# Patient Record
Sex: Male | Born: 1995 | Hispanic: No | Marital: Single | Smoking: Current every day smoker
Health system: Southern US, Community
[De-identification: ages and names within clinical notes are randomized; demographics above are authoritative.]

---

## 2014-05-20 ENCOUNTER — Encounter (HOSPITAL_COMMUNITY): Payer: Self-pay | Admitting: Emergency Medicine

## 2014-05-20 ENCOUNTER — Emergency Department (HOSPITAL_COMMUNITY)
Admission: EM | Admit: 2014-05-20 | Discharge: 2014-05-21 | Disposition: A | Discharge status: Home / Self Care | Payer: Self-pay | Attending: Emergency Medicine | Admitting: Emergency Medicine

## 2014-05-20 DIAGNOSIS — F1290 Cannabis use, unspecified, uncomplicated: Secondary | ICD-10-CM | POA: Insufficient documentation

## 2014-05-20 DIAGNOSIS — Z720 Tobacco use: Secondary | ICD-10-CM | POA: Insufficient documentation

## 2014-05-20 LAB — RAPID URINE DRUG SCREEN, HOSP PERFORMED
AMPHETAMINES: NOT DETECTED
BARBITURATES: NOT DETECTED
Benzodiazepines: NOT DETECTED
Cocaine: NOT DETECTED
OPIATES: NOT DETECTED
Tetrahydrocannabinol: POSITIVE — AB

## 2014-05-20 MED ORDER — LORAZEPAM 2 MG/ML IJ SOLN
1.0000 mg | Freq: Once | INTRAMUSCULAR | Status: AC
  Administered 2014-05-20: 1 mg via INTRAMUSCULAR
  Filled 2014-05-20: qty 1

## 2014-05-20 MED ORDER — LORAZEPAM 1 MG PO TABS
1.0000 mg | ORAL_TABLET | Freq: Once | ORAL | Status: AC
  Administered 2014-05-20: 1 mg via ORAL
  Filled 2014-05-20: qty 1

## 2014-05-20 NOTE — ED Notes (Signed)
Pt resting comfortably, no signs of distress or pain noted.

## 2014-05-20 NOTE — ED Notes (Signed)
Pt EMS pt c/o mandibular stiffness and 10/10 cramping pain, onset after smoking marijuana 3 hours ago at 1500. Denies other issues currently.

## 2014-05-20 NOTE — ED Provider Notes (Signed)
CSN: [id number]     Arrival date & time 05/20/14  1829 History  This chart was scribed for Mathew Madura, PA-C, working with Mathew Mo, MD by Elon Spanner, ED Scribe. This patient was seen in room WTR9/WTR9 and the patient's care was started at 8:12 PM.    Chief Complaint  Patient presents with  . Jaw Pain   The history is provided by the patient. No language interpreter was used.   HPI Comments: Mathew Cooke is a 19 y.o. male who presents to the Emergency Department complaining of cramping jaw pain onset 3 hours ago after smoking marijuana.  He also complains of mouth numbness and neck pain. Patient states he has consumed alcohol today.  Patient reports a history of smoking marijuana.  Patient denies auditory/visual hallucinations.   History reviewed. No pertinent past medical history. History reviewed. No pertinent past surgical history. History reviewed. No pertinent family history. History  Substance Use Topics  . Smoking status: Current Every Day Smoker -- 1.00 packs/day    Types: Cigarettes  . Smokeless tobacco: Not on file  . Alcohol Use: Yes     Comment: Ocassionally     Review of Systems  Musculoskeletal: Positive for neck stiffness.  Psychiatric/Behavioral: Negative for hallucinations.  All other systems reviewed and are negative.   Allergies  Review of patient's allergies indicates no known allergies.  Home Medications   Prior to Admission medications   Not on File   BP 119/60 mmHg  Pulse 69  Temp(Src) 98.3 F (36.8 C) (Oral)  Resp 18  SpO2 97%   Physical Exam  Constitutional: He is oriented to person, place, and time. He appears well-developed and well-nourished. No distress.  Nontoxic/nonseptic appearing  HENT:  Head: Normocephalic and atraumatic.  Mouth/Throat: Oropharynx is clear and moist.  Eyes: Conjunctivae and EOM are normal. Pupils are equal, round, and reactive to light. No scleral icterus.  Neck: Normal range of motion. Neck supple.   Patient with his neck and a hyperextended state on initial presentation. He will follow commands and move his neck as instructed without difficulty. He exhibits normal range of motion.  Cardiovascular: Normal rate.   Pulmonary/Chest: Effort normal. No respiratory distress. He has no wheezes.  Respirations even and unlabored  Musculoskeletal: Normal range of motion.  Neurological: He is alert and oriented to person, place, and time. No cranial nerve deficit. He exhibits normal muscle tone. Coordination normal.  GCS 15. Speech is goal oriented. No cranial nerve deficits appreciated; symmetric eyebrow raise, no facial drooping, tongue midline. Patient has equal grip strength bilaterally and 5/5 strength against resistance in all extremities. Sensation to light touch intact. Patient ambulatory with steady gait.  Skin: Skin is warm and dry. No rash noted. He is not diaphoretic. No erythema. No pallor.  Psychiatric: He has a normal mood and affect. His behavior is normal.  Nursing note and vitals reviewed.   ED Course  Procedures (including critical care time)  DIAGNOSTIC STUDIES: Oxygen Saturation is 97% on RA, normal by my interpretation.    COORDINATION OF CARE:  8:25 PM Will order additional ativan.  Patient acknowledges and agrees with plan.    9:58 PM Upon recheck, no spasms or contracture noted.  Patient sleeping comfortably and snoring.    Labs Review Labs Reviewed  URINE RAPID DRUG SCREEN (HOSP PERFORMED) - Abnormal; Notable for the following:    Tetrahydrocannabinol POSITIVE (*)    All other components within normal limits    Imaging Review No results found.  EKG Interpretation None      MDM   Final diagnoses:  Marijuana use, episodic    19 year old male presents to the emergency department for further evaluation of neck pain and spasms after marijuana use. Suspect that marijuana was laced with a synthetic substance, not seen on UDS. Patient given Ativan in ED.  This resolved all of patient's symptoms, however, made him sleepy. As patient drove himself to the ED and denied any right home, patient kept in the ED for monitoring and evaluation. He has had no recurrence of his symptoms. He had a nonfocal neurologic exam on initial presentation. Patient has now been monitored for approximately 12 hours. Effects of the medication had worn off. No recurrence of neck spasms or pain. Patient stable for discharge at this time. Have advised patient to discontinue the use of marijuana.  I personally performed the services described in this documentation, which was scribed in my presence. The recorded information has been reviewed and is accurate.   Filed Vitals:   05/20/14 1829 05/20/14 1830 05/20/14 2127 05/21/14 0302  BP:  139/79 114/63 119/60  Pulse:  103 66 69  Temp:  98.6 F (37 C)  98.3 F (36.8 C)  TempSrc:  Oral  Oral  Resp:  SpO2: 100% 97% 96% 97%     Mathew Madura, PA-C 05/21/14 1610  Mathew Mo, MD 05/23/14 [phone]

## 2014-05-20 NOTE — ED Notes (Signed)
Pt had his head hyperextended while talking to this nurse.  States he cannot help it.  When pt was called to be placed in triage room 9, pt's head was not hyperextended.  Pt ambulated to the BR with head straight forward and not hyperextended.

## 2014-05-21 NOTE — ED Notes (Signed)
Pt has been able to eat, drink and ambulate to the BR without difficulty at this time.

## 2014-05-21 NOTE — Discharge Instructions (Signed)
Recommend that you discontinue marijuana use. Follow-up with your primary doctor as needed.  Marijuana Abuse Your exam shows you have used marijuana or pot. There are many health problems related to marijuana abuse. These include:  Bronchitis.  Chronic cough.  Emphysema.  Lung and upper airway cancer. Abusers also experience impairment in:  Memory.  Judgment.  Ability to learn.  Coordination. Students who smoke marijuana:  Get lower grades.  Are less likely to graduate than those who do not. Adults who abuse marijuana:  Have problems at work.  May even lose their jobs due to:  Poor work International aid/development worker.  Absenteeism. Attention, memory, and learning skills have been shown to be diminished for up to 6 months after stopping regular use, and there is evidence that the effects can be cumulative over a lifetime.  Heavier use of marijuana also puts a strain on relationships with friends and loved ones and can lead to moodiness and loss of confidence. Acute intoxication can lead to:  Increased anxiety.  A panic episode. It also increases the risk for having an automobile accident. This is especially true if the pot is combined with alcohol or other intoxicants. Treatment for acute intoxication is rarely needed. However, medicine to reduce anxiety may be helpful in some people. Millions of people are considered to be dependent on marijuana. It is long-term regular use that leads to addiction and all of its complex problems. Information on the problem of addiction and the health problems of long-term abuse is posted at the [facility] website, http://www.price-smith.com/. Consult with your doctor or counselor if you want further information and support in handling this common problem. Document Released: 05/26/2004 Document Revised: 07/11/2011 Document Reviewed: 03/13/2007 [facility] Patient Information 2015 [place], [place]. This information is not intended to replace advice  given to you by your health care provider. Make sure you discuss any questions you have with your health care provider.

## 2015-01-27 ENCOUNTER — Encounter (HOSPITAL_COMMUNITY): Payer: Self-pay

## 2015-01-27 ENCOUNTER — Emergency Department (HOSPITAL_COMMUNITY)
Admission: EM | Admit: 2015-01-27 | Discharge: 2015-01-27 | Disposition: A | Discharge status: Home / Self Care | Payer: Medicaid Other | Attending: Emergency Medicine | Admitting: Emergency Medicine

## 2015-01-27 DIAGNOSIS — R45851 Suicidal ideations: Secondary | ICD-10-CM

## 2015-01-27 DIAGNOSIS — R10819 Abdominal tenderness, unspecified site: Secondary | ICD-10-CM | POA: Diagnosis not present

## 2015-01-27 DIAGNOSIS — Z720 Tobacco use: Secondary | ICD-10-CM | POA: Insufficient documentation

## 2015-01-27 DIAGNOSIS — T1491XA Suicide attempt, initial encounter: Secondary | ICD-10-CM

## 2015-01-27 DIAGNOSIS — Y9389 Activity, other specified: Secondary | ICD-10-CM | POA: Insufficient documentation

## 2015-01-27 DIAGNOSIS — Y9289 Other specified places as the place of occurrence of the external cause: Secondary | ICD-10-CM | POA: Insufficient documentation

## 2015-01-27 DIAGNOSIS — T5491XA Toxic effect of unspecified corrosive substance, accidental (unintentional), initial encounter: Secondary | ICD-10-CM

## 2015-01-27 DIAGNOSIS — T50992A Poisoning by other drugs, medicaments and biological substances, intentional self-harm, initial encounter: Secondary | ICD-10-CM | POA: Insufficient documentation

## 2015-01-27 DIAGNOSIS — Y998 Other external cause status: Secondary | ICD-10-CM | POA: Insufficient documentation

## 2015-01-27 LAB — RAPID URINE DRUG SCREEN, HOSP PERFORMED
Amphetamines: NOT DETECTED
Barbiturates: NOT DETECTED
Benzodiazepines: NOT DETECTED
Cocaine: NOT DETECTED
OPIATES: NOT DETECTED
Tetrahydrocannabinol: NOT DETECTED

## 2015-01-27 LAB — CBC
HEMATOCRIT: 43.7 % (ref 39.0–52.0)
Hemoglobin: 14.9 g/dL (ref 13.0–17.0)
MCH: 31.8 pg (ref 26.0–34.0)
MCHC: 34.1 g/dL (ref 30.0–36.0)
MCV: 93.2 fL (ref 78.0–100.0)
Platelets: 230 10*3/uL (ref 150–400)
RBC: 4.69 MIL/uL (ref 4.22–5.81)
RDW: 12 % (ref 11.5–15.5)
WBC: 7.6 10*3/uL (ref 4.0–10.5)

## 2015-01-27 LAB — COMPREHENSIVE METABOLIC PANEL
ALK PHOS: 45 U/L (ref 38–126)
ALT: 15 U/L — AB (ref 17–63)
AST: 28 U/L (ref 15–41)
Albumin: 4 g/dL (ref 3.5–5.0)
Anion gap: 6 (ref 5–15)
BILIRUBIN TOTAL: 0.6 mg/dL (ref 0.3–1.2)
BUN: 10 mg/dL (ref 6–20)
CALCIUM: 9.5 mg/dL (ref 8.9–10.3)
CO2: 28 mmol/L (ref 22–32)
CREATININE: 0.92 mg/dL (ref 0.61–1.24)
Chloride: 103 mmol/L (ref 101–111)
Glucose, Bld: 87 mg/dL (ref 65–99)
Potassium: 4.5 mmol/L (ref 3.5–5.1)
Sodium: 137 mmol/L (ref 135–145)
TOTAL PROTEIN: 6.8 g/dL (ref 6.5–8.1)

## 2015-01-27 LAB — SALICYLATE LEVEL

## 2015-01-27 LAB — ACETAMINOPHEN LEVEL

## 2015-01-27 LAB — LIPASE, BLOOD: LIPASE: 28 U/L (ref 22–51)

## 2015-01-27 MED ORDER — ONDANSETRON 4 MG PO TBDP
4.0000 mg | ORAL_TABLET | Freq: Once | ORAL | Status: AC
  Administered 2015-01-27: 4 mg via ORAL
  Filled 2015-01-27: qty 1

## 2015-01-27 NOTE — ED Notes (Signed)
Tele-psych at bedside.

## 2015-01-27 NOTE — Discharge Instructions (Signed)
Patient with intentional ingestion of caustic substance. Eating and drinking normally. He will need strict suicide watch and close follow up with Psychiatrist and psych services in jail.  He will very likely need to be started on antidepressants. He is medically clear in terms of his caustic ingestion.  Suicidal Feelings, How to Help Yourself Everyone feels sad or unhappy at times, but depressing thoughts and feelings of hopelessness can lead to thoughts of suicide. It can seem as if life is too tough to handle. If you feel as though you have reached the point where suicide is the only answer, it is time to let someone know immediately.  HOW TO COPE AND PREVENT SUICIDE  Let family, friends, teachers, or counselors know. Get help. Try not to isolate yourself from those who care about you. Even though you may not feel sociable, talk with someone every day. It is best if it is face-to-face. Remember, they will want to help you.  Eat a regularly spaced and well-balanced diet.  Get plenty of rest.  Avoid alcohol and drugs because they will only make you feel worse and may also lower your inhibitions. Remove them from the home. If you are thinking of taking an overdose of your prescribed medicines, give your medicines to someone who can give them to you one day at a time. If you are on antidepressants, let your caregiver know of your feelings so he or she can provide a safer medicine, if that is a concern.  Remove weapons or poisons from your home.  Try to stick to routines. Follow a schedule and remind yourself that you have to keep that schedule every day.  Set some realistic goals and achieve them. Make a list and cross things off as you go. Accomplishments give a sense of worth. Wait until you are feeling better before doing things you find difficult or unpleasant to do.  If you are able, try to start exercising. Even half-hour periods of exercise each day will make you feel better. Getting out  in the sun or into nature helps you recover from depression faster. If you have a favorite place to walk, take advantage of that.  Increase safe activities that have always given you pleasure. This may include playing your favorite music, reading a good book, painting a picture, or playing your favorite instrument. Do whatever takes your mind off your depression.  Keep your living space well-lighted. GET HELP Contact a suicide hotline, crisis center, or local suicide prevention center for help right away. Local centers may include a hospital, clinic, community service organization, social service provider, or health department.  Call your local emergency services (911 in the [place]).  Call a suicide hotline:  1-800-273-TALK (726 146 7149) in the [place].  1-800-SUICIDE (365)503-9554) in the [place].  579-447-9476 in the [place] for Spanish-speaking counselors.  4-696-295-2WUX 5800663453) in the [place] for TTY users.  Visit the following websites for information and help:  National Suicide Prevention Lifeline: www.suicidepreventionlifeline.org  Hopeline: www.hopeline.com  [employer] for Suicide Prevention: https://www.ayers.com/  For lesbian, gay, bisexual, transgender, or questioning youth, contact [employer]:  3-664-4-I-HKVQQV 3083677043) in the [place].  www.thetrevorproject.org  In [place], treatment resources are listed in each province with listings available under [employer] for [employer] or similar titles. Another source for Crisis Centres by [place] is located at http://www.suicideprevention.ca/in-crisis-now/find-a-crisis-centre-now/crisis-centres Document Released: 10/23/2002 Document Revised: 07/11/2011 Document Reviewed: 08/13/2013 [facility] Patient Information 2015 [place], [place]. This information is not intended to replace  advice given to you by your health care provider. Make sure you discuss any  questions you have with your health care provider.  Major Depressive Disorder Major depressive disorder is a mental illness. It also may be called clinical depression or unipolar depression. Major depressive disorder usually causes feelings of sadness, hopelessness, or helplessness. Some people with this disorder do not feel particularly sad but lose interest in doing things they used to enjoy (anhedonia). Major depressive disorder also can cause physical symptoms. It can interfere with work, school, relationships, and other normal everyday activities. The disorder varies in severity but is longer lasting and more serious than the sadness we all feel from time to time in our lives. Major depressive disorder often is triggered by stressful life events or major life changes. Examples of these triggers include divorce, loss of your job or home, a move, and the death of a family member or close friend. Sometimes this disorder occurs for no obvious reason at all. People who have family members with major depressive disorder or bipolar disorder are at higher risk for developing this disorder, with or without life stressors. Major depressive disorder can occur at any age. It may occur just once in your life (single episode major depressive disorder). It may occur multiple times (recurrent major depressive disorder). SYMPTOMS People with major depressive disorder have either anhedonia or depressed mood on nearly a daily basis for at least 2 weeks or longer. Symptoms of depressed mood include:  Feelings of sadness (blue or down in the dumps) or emptiness.  Feelings of hopelessness or helplessness.  Tearfulness or episodes of crying (may be observed by others).  Irritability (children and adolescents). In addition to depressed mood or anhedonia or both, people with this disorder have at least four of the following symptoms:  Difficulty sleeping or sleeping too much.   Significant change (increase or  decrease) in appetite or weight.   Lack of energy or motivation.  Feelings of guilt and worthlessness.   Difficulty concentrating, remembering, or making decisions.  Unusually slow movement (psychomotor retardation) or restlessness (as observed by others).   Recurrent wishes for death, recurrent thoughts of self-harm (suicide), or a suicide attempt. People with major depressive disorder commonly have persistent negative thoughts about themselves, other people, and the world. People with severe major depressive disorder may experiencedistorted beliefs or perceptions about the world (psychotic delusions). They also may see or hear things that are not real (psychotic hallucinations). DIAGNOSIS Major depressive disorder is diagnosed through an assessment by your health care provider. Your health care provider will ask aboutaspects of your daily life, such as mood,sleep, and appetite, to see if you have the diagnostic symptoms of major depressive disorder. Your health care provider may ask about your medical history and use of alcohol or drugs, including prescription medicines. Your health care provider also may do a physical exam and blood work. This is because certain medical conditions and the use of certain substances can cause major depressive disorder-like symptoms (secondary depression). Your health care provider also may refer you to a mental health specialist for further evaluation and treatment. TREATMENT It is important to recognize the symptoms of major depressive disorder and seek treatment. The following treatments can be prescribed for this disorder:   Medicine. Antidepressant medicines usually are prescribed. Antidepressant medicines are thought to correct chemical imbalances in the brain that are commonly associated with major depressive disorder. Other types of medicine may be added if the symptoms do not respond to antidepressant medicines  alone or if psychotic delusions or  hallucinations occur.  Talk therapy. Talk therapy can be helpful in treating major depressive disorder by providing support, education, and guidance. Certain types of talk therapy also can help with negative thinking (cognitive behavioral therapy) and with relationship issues that trigger this disorder (interpersonal therapy). A mental health specialist can help determine which treatment is best for you. Most people with major depressive disorder do well with a combination of medicine and talk therapy. Treatments involving electrical stimulation of the brain can be used in situations with extremely severe symptoms or when medicine and talk therapy do not work over time. These treatments include electroconvulsive therapy, transcranial magnetic stimulation, and vagal nerve stimulation. Document Released: 08/13/2012 Document Revised: 09/02/2013 Document Reviewed: 08/13/2012 [facility] Patient Information 2015 [place], [place]. This information is not intended to replace advice given to you by your health care provider. Make sure you discuss any questions you have with your health care provider.

## 2015-01-27 NOTE — ED Notes (Signed)
Pt states he attempted to kill himself by drinking cleaning chemicals in prison. Pt c/o 10/10 abdominal pain and states "I can't feel my legs." Pt repeatedly states "I just wish I could die." EDP aware.
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
## 2015-01-27 NOTE — ED Provider Notes (Signed)
CSN: [id number]     Arrival date & time 01/27/15  8295 History   First MD Initiated Contact with Patient 01/27/15 [phone]     Chief Complaint  Patient presents with  . Ingestion     (Consider location/radiation/quality/duration/timing/severity/associated sxs/prior Treatment) HPI   19 year old male brought in by [employer]. He is a current inmate at the [facility]. Patient presents after intentional ingestion of 4 ounces of cleaning solution. Patient states that he was trying to kill himself. He states he has been in jail for about 4 days. He has no previous history of suicide attempts. Patient states that he needs to go to the psychiatric hospital. Patient states "I'm not playin'.  They can't keep me in no cage like an animal." Patient complains of abdominal pain and nausea currently. Patient drank 4 ounces of ammonium chlorohydrate  Which has a pH of 12-14. I have consulted with [facility]. They recommend nothing by mouth for 2 hours after initial ingestion. If patient feels ingestion. He will need a KUB and a GI consult. The recommended normal screening labs for suicide attempt, which included salicylates and Tylenol. The patient will need a minimum of 6 hours of observation. According to diabetes. He is currently under suicide watch at the jail.  No past medical history on file. No past surgical history on file. No family history on file. Social History  Substance Use Topics  . Smoking status: Current Every Day Smoker -- 1.00 packs/day    Types: Cigarettes  . Smokeless tobacco: Not on file  . Alcohol Use: Yes     Comment: Ocassionally    Review of Systems  Ten systems reviewed and are negative for acute change, except as noted in the HPI.    Allergies  Review of patient's allergies indicates no known allergies.  Home Medications   Prior to Admission medications   Not on File   Ht  (1.702 m)  Wt 160 lb (72.576 kg)  BMI 25.05  kg/m2 Physical Exam  Constitutional: He appears well-developed and well-nourished. No distress.  HENT:  Head: Normocephalic and atraumatic.  Eyes: Conjunctivae are normal. No scleral icterus.  Neck: Normal range of motion. Neck supple.  Cardiovascular: Normal rate, regular rhythm and normal heart sounds.   Pulmonary/Chest: Effort normal and breath sounds normal. No respiratory distress.  Abdominal: Soft. There is tenderness. There is guarding.  Patient with diffuse tenderness and guarding of the abdomen.  Musculoskeletal: He exhibits no edema.  Neurological: He is alert.  Skin: Skin is warm and dry. He is not diaphoretic.  Psychiatric: His behavior is normal.  Nursing note and vitals reviewed.   ED Course  Procedures (including critical care time) Labs Review Labs Reviewed - No data to display  Imaging Review No results found. I have personally reviewed and evaluated these images and lab results as part of my medical decision-making.   EKG Interpretation   Date/Time:  Tuesday January 27 2015 12:58:45 EDT Ventricular Rate:  78 PR Interval:  136 QRS Duration: 88 QT Interval:  393 QTC Calculation: 448 R Axis:   83 Text Interpretation:  Sinus rhythm EKG WITHIN NORMAL LIMITS Confirmed by  LITTLE MD, RACHEL ([id number]) on 01/27/2015 1:41:19 PM      MDM   Final diagnoses:  Ingestion of corrosive chemical, initial encounter  Suicidal intent   Patient able to tolerate po fluids and no vomiting. His labs are reassuring. Currently under observation    12:55 PM Patient under  watch of Deputies tied a cord around his neck and tried to choke himself. Dr. Clarene Duke is asking for consult with psych. I have ordered a telepsych consult.  2:30 Pm Patient observed for 6 hours without any complications, no vomiting, no abdominal pain. He is asking to eat.  Patient seen by Psych. There is a psychiatrist at the jail. No immenent threat of completed suicide and the patient can be treated  for his depression under strict suicide watch with their sevices per Psychiatric consult. Patient appears safe for discharge at this time/  Arthor Captain, PA-C 01/27/15 1556  Laurence Spates, MD 01/28/15 1659

## 2015-08-17 ENCOUNTER — Emergency Department (HOSPITAL_COMMUNITY)
Admission: EM | Admit: 2015-08-17 | Discharge: 2015-08-17 | Disposition: A | Discharge status: Home / Self Care | Payer: Medicaid Other | Attending: Emergency Medicine | Admitting: Emergency Medicine

## 2015-08-17 ENCOUNTER — Encounter (HOSPITAL_COMMUNITY): Payer: Self-pay

## 2015-08-17 ENCOUNTER — Emergency Department (HOSPITAL_COMMUNITY): Payer: Medicaid Other

## 2015-08-17 DIAGNOSIS — F17210 Nicotine dependence, cigarettes, uncomplicated: Secondary | ICD-10-CM | POA: Insufficient documentation

## 2015-08-17 DIAGNOSIS — M545 Low back pain: Secondary | ICD-10-CM | POA: Insufficient documentation

## 2015-08-17 DIAGNOSIS — R45851 Suicidal ideations: Secondary | ICD-10-CM | POA: Insufficient documentation

## 2015-08-17 DIAGNOSIS — M549 Dorsalgia, unspecified: Secondary | ICD-10-CM | POA: Diagnosis present

## 2015-08-17 MED ORDER — KETOROLAC TROMETHAMINE 30 MG/ML IJ SOLN
30.0000 mg | Freq: Once | INTRAMUSCULAR | Status: AC
  Administered 2015-08-17: 30 mg via INTRAMUSCULAR
  Filled 2015-08-17: qty 1
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
## 2015-08-17 NOTE — Discharge Instructions (Signed)
Back Pain, Adult Back pain is very common. The pain often gets better over time. The cause of back pain is usually not dangerous. Most people can learn to manage their back pain on their own.  HOME CARE  Watch your back pain for any changes. The following actions may help to lessen any pain you are feeling:  Stay active. Start with short walks on flat ground if you can. Try to walk farther each day.  Exercise regularly as told by your doctor. Exercise helps your back heal faster. It also helps avoid future injury by keeping your muscles strong and flexible.  Do not sit, drive, or stand in one place for more than 30 minutes.  Do not stay in bed. Resting more than 1-2 days can slow down your recovery.  Be careful when you bend or lift an object. Use good form when lifting:  Bend at your knees.  Keep the object close to your body.  Do not twist.  Sleep on a firm mattress. Lie on your side, and bend your knees. If you lie on your back, put a pillow under your knees.  Take medicines only as told by your doctor.  Put ice on the injured area.  Put ice in a plastic bag.  Place a towel between your skin and the bag.  Leave the ice on for 20 minutes, 2-3 times a day for the first 2-3 days. After that, you can switch between ice and heat packs.  Avoid feeling anxious or stressed. Find good ways to deal with stress, such as exercise.  Maintain a healthy weight. Extra weight puts stress on your back. GET HELP IF:   You have pain that does not go away with rest or medicine.  You have worsening pain that goes down into your legs or buttocks.  You have pain that does not get better in one week.  You have pain at night.  You lose weight.  You have a fever or chills. GET HELP RIGHT AWAY IF:   You cannot control when you poop (bowel movement) or pee (urinate).  Your arms or legs feel weak.  Your arms or legs lose feeling (numbness).  You feel sick to your stomach (nauseous) or  throw up (vomit).  You have belly (abdominal) pain.  You feel like you may pass out (faint).   This information is not intended to replace advice given to you by your health care provider. Make sure you discuss any questions you have with your health care provider.   Document Released: 10/05/2007 Document Revised: 05/09/2014 Document Reviewed: 08/20/2013 Elsevier Interactive Patient Education 2016 [employer].  Suicidal Feelings: How to Help Yourself Suicide is the taking of one's own life. If you feel as though life is getting too tough to handle and are thinking about suicide, get help right away. To get help:  Call your local emergency services (911 in the [place]  Call a suicide hotline to speak with a trained counselor who understands how you are feeling. The following is a list of suicide hotlines in the [place]. For a list of hotlines in [place], visit InkDistributor.it.  1-800-273-TALK (570)442-4799).  1-800-SUICIDE 587-601-3964).  781-817-4687. This is a hotline for Spanish speakers.  3-329-518-8CZY 514 678 8325). This is a hotline for TTY users.  1-866-4-U-TREVOR 270-582-6044). This is a hotline for lesbian, gay, bisexual, transgender, or questioning youth.  Contact a crisis center or a local suicide prevention center. To find a crisis center or suicide prevention center:  Call  your local hospital, clinic, community service organization, mental health center, social service provider, or health department. Ask for assistance in connecting to a crisis center.  Visit https://www.patel-king.com/ for a list of crisis centers in the [place], or visit www.suicideprevention.ca/thinking-about-suicide/find-a-crisis-centre for a list of centers in [place].  Visit the following websites:  National Suicide Prevention Lifeline: www.suicidepreventionlifeline.org  Hopeline:  www.hopeline.com  [employer] for Suicide Prevention: https://www.ayers.com/  [employer] (for lesbian, gay, bisexual, transgender, or questioning youth): www.thetrevorproject.org HOW CAN I HELP MYSELF FEEL BETTER?  Promise yourself that you will not do anything drastic when you have suicidal feelings. Remember, there is hope. Many people have gotten through suicidal thoughts and feelings, and you will, too. You may have gotten through them before, and this proves that you can get through them again.  Let family, friends, teachers, or counselors know how you are feeling. Try not to isolate yourself from those who care about you. Remember, they will want to help you. Talk with someone every day, even if you do not feel sociable. Face-to-face conversation is best.  Call a mental health professional and see one regularly.  Visit your primary health care provider every year.  Eat a well-balanced diet, and space your meals so you eat regularly.  Get plenty of rest.  Avoid alcohol and drugs, and remove them from your home. They will only make you feel worse.  If you are thinking of taking a lot of medicine, give your medicine to someone who can give it to you one day at a time. If you are on antidepressants and are concerned you will overdose, let your health care provider know so he or she can give you safer medicines. Ask your mental health professional about the possible side effects of any medicines you are taking.  Remove weapons, poisons, knives, and anything else that could harm you from your home.  Try to stick to routines. Follow a schedule every day. Put self-care on your schedule.  Make a list of realistic goals, and cross them off when you achieve them. Accomplishments give a sense of worth.  Wait until you are feeling better before doing the things you find difficult or unpleasant.  Exercise if you are able. You will feel better if you exercise for even a half hour each  day.  Go out in the sun or into nature. This will help you recover from depression faster. If you have a favorite place to walk, go there.  Do the things that have always given you pleasure. Play your favorite music, read a good book, paint a picture, play your favorite instrument, or do anything else that takes your mind off your depression if it is safe to do.  Keep your living space well lit.  When you are feeling well, write yourself a letter about tips and support that you can read when you are not feeling well.  Remember that life's difficulties can be sorted out with help. Conditions can be treated. You can work on thoughts and strategies that serve you well.   This information is not intended to replace advice given to you by your health care provider. Make sure you discuss any questions you have with your health care provider.   Document Released: 10/23/2002 Document Revised: 05/09/2014 Document Reviewed: 08/13/2013 Elsevier Interactive Patient Education [employer]

## 2015-08-17 NOTE — ED Notes (Signed)
Patient transported to X-ray 
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
## 2015-12-10 ENCOUNTER — Emergency Department (HOSPITAL_COMMUNITY)
Admission: EM | Admit: 2015-12-10 | Discharge: 2015-12-10 | Disposition: A | Discharge status: Home / Self Care | Payer: Medicaid Other | Attending: Emergency Medicine | Admitting: Emergency Medicine

## 2015-12-10 ENCOUNTER — Encounter (HOSPITAL_COMMUNITY): Payer: Self-pay | Admitting: *Deleted

## 2015-12-10 DIAGNOSIS — F17210 Nicotine dependence, cigarettes, uncomplicated: Secondary | ICD-10-CM | POA: Insufficient documentation

## 2015-12-10 DIAGNOSIS — L02416 Cutaneous abscess of left lower limb: Secondary | ICD-10-CM | POA: Diagnosis present

## 2015-12-10 DIAGNOSIS — L0291 Cutaneous abscess, unspecified: Secondary | ICD-10-CM

## 2015-12-10 MED ORDER — SULFAMETHOXAZOLE-TRIMETHOPRIM 800-160 MG PO TABS: 1.0000 | ORAL_TABLET | Freq: Two times a day (BID) | ORAL | 0 refills | Status: DC

## 2015-12-10 NOTE — ED Provider Notes (Signed)
[facility]PT Provider Note   CSN: [id number] Arrival date & time: 12/10/15  1041  First Provider Contact:  None    By signing my name below, I, Essence Howell, attest that this documentation has been prepared under the direction and in the presence of [employer], PA-C Electronically Signed: Charline Bills, ED Scribe 12/10/2015 at 11:00 AM.   History   Chief Complaint Chief Complaint  Patient presents with  . Insect Bite   HPI Mathew Cooke is a 20 y.o. male who presents to the Emergency Department complaining of a possible insect bite to the left lower extremity first noticed 2 days ago. Pt reports intermittent 8/10 sharp, non-radiating pain that is exacerbated with palpation to the insect bite region/abscess. No treatments tried PTA. He reports associated redness, swelling, and mild warmth to the area. Pt denies red streaking, drainage, fevers, chills, CP, SOB, abd pain, N/V/D/C, hematuria, dysuria, arthralgias, numbness, tingling, weakness, or any other associated symptoms. Denies IVDU. Denies seeing an insect bite him. Denies immunosuppressant conditions.   The history is provided by the patient and medical records. No language interpreter was used.  Abscess  Location:  Leg Leg abscess location:  L lower leg Abscess quality: painful, redness and warmth   Abscess quality: not draining and no fluctuance   Red streaking: no   Duration:  2 days Progression:  Unchanged Pain details:    Quality:  Sharp   Severity:  Moderate   Duration:  2 days   Timing:  Intermittent   Progression:  Unchanged Chronicity:  New Context: insect bite/sting (possible)   Context: not diabetes, not immunosuppression, not injected drug use and not skin injury   Relieved by:  None tried Worsened by:  Draining/squeezing Ineffective treatments:  None tried Associated symptoms: no fever, no nausea and no vomiting     No past medical history on file.  Patient Active Problem List   Diagnosis Date Noted  . First known suicide attempt (HCC) 01/27/2015    No past surgical history on file.   Home Medications    Prior to Admission medications   Medication Sig Start Date End Date Taking? Authorizing Provider  diphenhydramine-acetaminophen (TYLENOL PM) 25-500 MG TABS tablet Take 1 tablet by mouth at bedtime as needed (sleep/pain).    Historical Provider, MD    Family History No family history on file.  Social History Social History  Substance Use Topics  . Smoking status: Current Every Day Smoker    Packs/day: 1.00    Types: Cigarettes  . Smokeless tobacco: Not on file  . Alcohol use Yes     Comment: Ocassionally     Allergies   Review of patient's allergies indicates no known allergies.   Review of Systems Review of Systems  Constitutional: Negative for chills and fever.  Respiratory: Negative for shortness of breath.   Cardiovascular: Negative for chest pain.  Gastrointestinal: Negative for abdominal pain, constipation, diarrhea, nausea and vomiting.  Genitourinary: Negative for dysuria and hematuria.  Musculoskeletal: Positive for myalgias (abscess painful). Negative for arthralgias.  Skin: Positive for color change and wound (abscess).  Allergic/Immunologic: Negative for immunocompromised state.  Neurological: Negative for weakness and numbness.  Psychiatric/Behavioral: Negative for confusion.  10 Systems reviewed and are negative for acute change except as noted in the HPI.   Physical Exam Updated Vital Signs BP 128/76 (BP Location: Right Arm)   Pulse 74   Temp 98.2 F (36.8 C) (Oral)   Resp 12   SpO2 98%   Physical Exam  Constitutional: He is oriented to person, place, and time. Vital signs are normal. He appears well-developed and well-nourished.  Non-toxic appearance. No distress.  Afebrile, nontoxic, NAD  HENT:  Head: Normocephalic and atraumatic.  Mouth/Throat: Mucous membranes are normal.  Eyes: Conjunctivae and EOM are normal.  Right eye exhibits no discharge. Left eye exhibits no discharge.  Neck: Normal range of motion. Neck supple.  Cardiovascular: Normal rate and intact distal pulses.   Pulmonary/Chest: Effort normal. No respiratory distress.  Abdominal: Normal appearance. He exhibits no distension.  Musculoskeletal: Normal range of motion.       Left lower leg: He exhibits tenderness and swelling (abscess). He exhibits no bony tenderness.  L anterior lower leg with small approximately 1 cm indurated abscess with mild erythema and warmth, no drainage or red streaking, no fluctuance. No surrounding cellulitis, with minimal TTP over the abscess but no other focal bony ttp. No pedal edema. strength and sensation grossly intact. Distal pulses intact. Compartments soft.   Neurological: He is alert and oriented to person, place, and time. He has normal strength. No sensory deficit.  Skin: Skin is warm, dry and intact. No rash noted. There is erythema (to abscess).  L leg abscess erythematous as described above  Psychiatric: He has a normal mood and affect.  Nursing note and vitals reviewed.   ED Treatments / Results  Labs (all labs ordered are listed, but only abnormal results are displayed) Labs Reviewed - No data to display  EKG  EKG Interpretation None       Radiology No results found.  Procedures Procedures (including critical care time) DIAGNOSTIC STUDIES: Oxygen Saturation is 98% on RA, normal by my interpretation.    COORDINATION OF CARE: 10:56 AM-Discussed treatment plan with pt at bedside and pt agreed to plan.   Medications Ordered in ED Medications - No data to display   Initial Impression / Assessment and Plan / ED Course  I have reviewed the triage vital signs and the nursing notes.  Pertinent labs & imaging results that were available during my care of the patient were reviewed by me and considered in my medical decision making (see chart for details).  Clinical Course    20  y.o. male here with small abscess to L leg, no fluctuance or evidence of surrounding cellulitis, NVI with soft compartments. Doubt it would be amendable to I&D today, will start on abx. F/up with PCP or UCC for recheck in 2 days to see if it becomes fluctuant. Wound care and heat care discussed. Tylenol/motrin for pain. I explained the diagnosis and have given explicit precautions to return to the ER including for any other new or worsening symptoms. The patient understands and accepts the medical plan as it's been dictated and I have answered their questions. Discharge instructions concerning home care and prescriptions have been given. The patient is STABLE and is discharged to home in good condition.   I personally performed the services described in this documentation, which was scribed in my presence. The recorded information has been reviewed and is accurate.  BP 128/76 (BP Location: Right Arm)   Pulse 74   Temp 98.2 F (36.8 C) (Oral)   Resp 12   SpO2 98%    Final Clinical Impressions(s) / ED Diagnoses   Final diagnoses:  Abscess    New Prescriptions New Prescriptions   SULFAMETHOXAZOLE-TRIMETHOPRIM (BACTRIM DS,SEPTRA DS) 800-160 MG TABLET    Take 1 tablet by mouth 2 (two) times daily.     France Ravens  Camprubi-Soms, PA-C 12/10/15 1106    Maia Plan, MD 12/10/15 [phone]

## 2015-12-10 NOTE — ED Triage Notes (Signed)
PT has a insect bite to RT anterior ,lower leg.

## 2015-12-10 NOTE — Discharge Instructions (Signed)
Keep wound clean and dry. Apply warm compresses to affected area throughout the day, no more than 20 minutes of heat to the skin per hour, at least 4 times daily. Take antibiotic until it is finished. Take tylenol or motrin as needed for pain. Followup with [facility]/Primary Care doctor in 2 days for wound recheck. Monitor area for signs of infection to include, but not limited to: increasing pain, spreading redness, drainage/pus, worsening swelling, or fevers. Return to emergency department for emergent changing or worsening symptoms.

## 2017-01-19 ENCOUNTER — Encounter (HOSPITAL_COMMUNITY): Payer: Self-pay | Admitting: Emergency Medicine

## 2017-01-19 DIAGNOSIS — Z79899 Other long term (current) drug therapy: Secondary | ICD-10-CM | POA: Diagnosis not present

## 2017-01-19 DIAGNOSIS — F17210 Nicotine dependence, cigarettes, uncomplicated: Secondary | ICD-10-CM | POA: Diagnosis not present

## 2017-01-19 DIAGNOSIS — Z202 Contact with and (suspected) exposure to infections with a predominantly sexual mode of transmission: Secondary | ICD-10-CM | POA: Diagnosis not present

## 2017-01-19 LAB — URINALYSIS, COMPLETE (UACMP) WITH MICROSCOPIC
BACTERIA UA: NONE SEEN
BILIRUBIN URINE: NEGATIVE
GLUCOSE, UA: NEGATIVE mg/dL
HGB URINE DIPSTICK: NEGATIVE
Ketones, ur: NEGATIVE mg/dL
Leukocytes, UA: NEGATIVE
NITRITE: NEGATIVE
Protein, ur: NEGATIVE mg/dL
Specific Gravity, Urine: 1.02 (ref 1.005–1.030)
pH: 5 (ref 5.0–8.0)

## 2017-01-19 NOTE — ED Triage Notes (Signed)
Patient requesting STD screening reports his sexual partner has urinary discomfort/vaginal discharge and multiple sexual encounters. Denies fever or chills . No penile discharge or irritation .

## 2017-01-20 ENCOUNTER — Emergency Department (HOSPITAL_COMMUNITY)
Admission: EM | Admit: 2017-01-20 | Discharge: 2017-01-20 | Disposition: A | Discharge status: Home / Self Care | Payer: Medicare Other | Attending: Emergency Medicine | Admitting: Emergency Medicine

## 2017-01-20 DIAGNOSIS — Z202 Contact with and (suspected) exposure to infections with a predominantly sexual mode of transmission: Secondary | ICD-10-CM

## 2017-01-20 NOTE — ED Notes (Signed)
ED Provider at bedside. 

## 2017-01-20 NOTE — ED Provider Notes (Signed)
[facility] Provider Note   CSN: [id number] Arrival date & time: 01/19/17  2124     History   Chief Complaint Chief Complaint  Patient presents with  . SEXUALLY TRANSMITTED DISEASE    HPI Mathew Cooke is a 21 y.o. male.  HPI Patient is a 21 year old male who is here with his girlfriend who is having vaginal discharge.  The patient wishes to be checked for STDs.  He has no dysuria or urinary frequency.  Denies penile discharge.  Denies abdominal pain.  No nausea.  Patient is without symptoms.  He just wants to be checked.   History reviewed. No pertinent past medical history.  Patient Active Problem List   Diagnosis Date Noted  . First known suicide attempt (HCC) 01/27/2015    History reviewed. No pertinent surgical history.     Home Medications    Prior to Admission medications   Medication Sig Start Date End Date Taking? Authorizing Provider  diphenhydramine-acetaminophen (TYLENOL PM) 25-500 MG TABS tablet Take 1 tablet by mouth at bedtime as needed (sleep/pain).    [provider]  sulfamethoxazole-trimethoprim (BACTRIM DS,SEPTRA DS) 800-160 MG tablet Take 1 tablet by mouth 2 (two) times daily. 12/10/15   Street, [place], PA-C    Family History No family history on file.  Social History Social History  Substance Use Topics  . Smoking status: Current Every Day Smoker    Packs/day: 1.00    Types: Cigarettes  . Smokeless tobacco: Never Used  . Alcohol use Yes     Comment: Ocassionally     Allergies   Patient has no known allergies.   Review of Systems Review of Systems  All other systems reviewed and are negative.    Physical Exam Updated Vital Signs BP (!) 128/92 (BP Location: Left Arm)   Pulse 95   Temp 98.4 F (36.9 C) (Oral)   Resp 18   Ht  (1.702 m)   Wt 72.6 kg (160 lb)   SpO2 99%   BMI 25.06 kg/m   Physical Exam  Constitutional: He is oriented to person, place, and time. He appears well-developed and  well-nourished.  HENT:  Head: Normocephalic.  Eyes: EOM are normal.  Neck: Normal range of motion.  Pulmonary/Chest: Effort normal.  Abdominal: He exhibits no distension.  Musculoskeletal: Normal range of motion.  Neurological: He is alert and oriented to person, place, and time.  Psychiatric: He has a normal mood and affect.  Nursing note and vitals reviewed.    ED Treatments / Results  Labs (all labs ordered are listed, but only abnormal results are displayed) Labs Reviewed  URINALYSIS, COMPLETE (UACMP) WITH MICROSCOPIC - Abnormal; Notable for the following:       Result Value   Squamous Epithelial / LPF 0-5 (*)    All other components within normal limits    EKG  EKG Interpretation None       Radiology No results found.  Procedures Procedures (including critical care time)  Medications Ordered in ED Medications - No data to display   Initial Impression / Assessment and Plan / ED Course  I have reviewed the triage vital signs and the nursing notes.  Pertinent labs & imaging results that were available during my care of the patient were reviewed by me and considered in my medical decision making (see chart for details).     Patient referred to the [facility] department for STD screening.  A symptomatically.  No indication for acute management or treatment  in the ER.  Final Clinical Impressions(s) / ED Diagnoses   Final diagnoses:  Possible exposure to STD    New Prescriptions New Prescriptions   No medications on file     Azalia Bilis, MD 01/20/17 1011

## 2017-01-21 ENCOUNTER — Emergency Department (HOSPITAL_COMMUNITY)
Admission: EM | Admit: 2017-01-21 | Discharge: 2017-01-22 | Disposition: A | Discharge status: Home / Self Care | Payer: Medicare Other | Attending: Emergency Medicine | Admitting: Emergency Medicine

## 2017-01-21 ENCOUNTER — Encounter (HOSPITAL_COMMUNITY): Payer: Self-pay | Admitting: Emergency Medicine

## 2017-01-21 DIAGNOSIS — F17210 Nicotine dependence, cigarettes, uncomplicated: Secondary | ICD-10-CM | POA: Diagnosis not present

## 2017-01-21 DIAGNOSIS — Z7251 High risk heterosexual behavior: Secondary | ICD-10-CM

## 2017-01-21 DIAGNOSIS — Z79899 Other long term (current) drug therapy: Secondary | ICD-10-CM | POA: Diagnosis not present

## 2017-01-21 DIAGNOSIS — R300 Dysuria: Secondary | ICD-10-CM | POA: Diagnosis present

## 2017-01-21 NOTE — ED Notes (Signed)
Called for triage. No answer. 

## 2017-01-21 NOTE — ED Notes (Signed)
Called for triage, no response.

## 2017-01-21 NOTE — ED Triage Notes (Signed)
Pt states he will like to have a HIV test on the ED pt was seen yesterday by Dr. Patria Mane and sent home with a follow up on [facility], pt denies any pain or any symptoms at this time.

## 2017-01-22 DIAGNOSIS — Z7251 High risk heterosexual behavior: Secondary | ICD-10-CM | POA: Diagnosis not present

## 2017-01-22 LAB — RPR: RPR: NONREACTIVE

## 2017-01-22 LAB — HIV ANTIBODY (ROUTINE TESTING W REFLEX): HIV Screen 4th Generation wRfx: NONREACTIVE

## 2017-01-22 NOTE — ED Notes (Signed)
Pt verbalized understanding of discharge instructions, nad, ambulatory upon d.c

## 2017-01-22 NOTE — ED Provider Notes (Signed)
[facility] Provider Note   CSN: [id number] Arrival date & time: 01/21/17  2237     History   Chief Complaint Chief Complaint  Patient presents with  . Exposure to STD    HPI Mathew Cooke is a 21 y.o. male.  21 year old male presents to the emergency department requesting STD testing. He was seen 2 days ago for similar complaints. He was told to follow-up at the health department, but has neglected to do so. He reports dysuria; however, his urinalysis 2 days ago showed no evidence of urinary tract infection and he denied dysuria in triage. He denies any other symptoms to suggest STI. He does have a history of unprotected sexual intercourse.   The history is provided by the patient. No language interpreter was used.  Exposure to STD     History reviewed. No pertinent past medical history.  Patient Active Problem List   Diagnosis Date Noted  . First known suicide attempt (HCC) 01/27/2015    History reviewed. No pertinent surgical history.     Home Medications    Prior to Admission medications   Medication Sig Start Date End Date Taking? Authorizing Provider  diphenhydramine-acetaminophen (TYLENOL PM) 25-500 MG TABS tablet Take 1 tablet by mouth at bedtime as needed (sleep/pain).    [provider]  sulfamethoxazole-trimethoprim (BACTRIM DS,SEPTRA DS) 800-160 MG tablet Take 1 tablet by mouth 2 (two) times daily. 12/10/15   Street, [place], PA-C    Family History No family history on file.  Social History Social History  Substance Use Topics  . Smoking status: Current Every Day Smoker    Packs/day: 1.00    Types: Cigarettes  . Smokeless tobacco: Never Used  . Alcohol use Yes     Comment: Ocassionally     Allergies   Patient has no known allergies.   Review of Systems Review of Systems Ten systems reviewed and are negative for acute change, except as noted in the HPI.    Physical Exam Updated Vital Signs BP (!) 147/100 (BP  Location: Right Arm)   Pulse 82   Temp 98.2 F (36.8 C) (Oral)   Resp 16   Ht 5\' 7"  (1.702 m)   Wt 72.6 kg (160 lb)   SpO2 98%   BMI 25.06 kg/m   Physical Exam  Constitutional: He is oriented to person, place, and time. He appears well-developed and well-nourished. No distress.  HENT:  Head: Normocephalic and atraumatic.  Eyes: Conjunctivae and EOM are normal. No scleral icterus.  Neck: Normal range of motion.  Cardiovascular: Normal rate, regular rhythm and intact distal pulses.   Pulmonary/Chest: Effort normal. No respiratory distress. He has no wheezes.  Musculoskeletal: Normal range of motion.  Neurological: He is alert and oriented to person, place, and time. He exhibits normal muscle tone. Coordination normal.  Skin: Skin is warm and dry. No rash noted. He is not diaphoretic. No erythema. No pallor.  Psychiatric: He has a normal mood and affect. His behavior is normal.  Nursing note and vitals reviewed.    ED Treatments / Results  Labs (all labs ordered are listed, but only abnormal results are displayed) Labs Reviewed  HIV ANTIBODY (ROUTINE TESTING)  RPR  GC/CHLAMYDIA PROBE AMP ([place]) NOT AT [facility]   Component     Latest Ref Rng & Units 01/19/2017  Color, Urine     YELLOW YELLOW  Appearance     CLEAR CLEAR  Specific Gravity, Urine     1.005 - 1.030 1.020  pH     5.0 - 8.0 5.0  Glucose     NEGATIVE mg/dL NEGATIVE  Hgb urine dipstick     NEGATIVE NEGATIVE  Bilirubin Urine     NEGATIVE NEGATIVE  Ketones, ur     NEGATIVE mg/dL NEGATIVE  Protein     NEGATIVE mg/dL NEGATIVE  Nitrite     NEGATIVE NEGATIVE  Leukocytes, UA     NEGATIVE NEGATIVE  RBC / HPF     0 - 5 RBC/hpf 0-5  WBC, UA     0 - 5 WBC/hpf 0-5  Bacteria, UA     NONE SEEN NONE SEEN  Squamous Epithelial / LPF     NONE SEEN 0-5 (A)  Mucus      PRESENT  Hyaline Casts, UA      PRESENT    EKG  EKG Interpretation None       Radiology No results found.  Procedures Procedures  (including critical care time)  Medications Ordered in ED Medications - No data to display   Initial Impression / Assessment and Plan / ED Course  I have reviewed the triage vital signs and the nursing notes.  Pertinent labs & imaging results that were available during my care of the patient were reviewed by me and considered in my medical decision making (see chart for details).     21 year old male presents requesting STD check. He was seen yesterday for similar complaints. He was told to follow-up with the health department, but neglected to do so. He returns today for STD testing. Patient with a negative urinalysis 48 hours prior. STD tests ordered. Patient aware that these take 48 hours to result. He has been instructed to follow-up further with the health department. No indication for further emergent work up at this time. Return precautions discussed and provided. Patient discharged in stable condition with no unaddressed concerns.   Vitals:   01/21/17 2309 01/21/17 2311  BP: (!) 147/100   Pulse: 82   Resp: 16   Temp: 98.2 F (36.8 C)   TempSrc: Oral   SpO2: 98%   Weight:  72.6 kg (160 lb)  Height:   (1.702 m)     Final Clinical Impressions(s) / ED Diagnoses   Final diagnoses:  History of unprotected sex    New Prescriptions New Prescriptions   No medications on file     [name], Cordelia Poche 01/22/17 0036    Cathren Laine, MD 01/24/17 1114

## 2017-01-23 LAB — GC/CHLAMYDIA PROBE AMP (~~LOC~~) NOT AT ARMC
CHLAMYDIA, DNA PROBE: NEGATIVE
Neisseria Gonorrhea: NEGATIVE

## 2017-02-12 ENCOUNTER — Emergency Department (HOSPITAL_COMMUNITY)
Admission: EM | Admit: 2017-02-12 | Discharge: 2017-02-12 | Disposition: A | Discharge status: Home / Self Care | Payer: Medicare Other | Attending: Emergency Medicine | Admitting: Emergency Medicine

## 2017-02-12 ENCOUNTER — Emergency Department (HOSPITAL_COMMUNITY): Payer: Medicare Other

## 2017-02-12 ENCOUNTER — Encounter (HOSPITAL_COMMUNITY): Payer: Self-pay

## 2017-02-12 DIAGNOSIS — J029 Acute pharyngitis, unspecified: Secondary | ICD-10-CM | POA: Diagnosis not present

## 2017-02-12 DIAGNOSIS — Z5321 Procedure and treatment not carried out due to patient leaving prior to being seen by health care provider: Secondary | ICD-10-CM | POA: Insufficient documentation

## 2017-02-12 DIAGNOSIS — R05 Cough: Secondary | ICD-10-CM | POA: Diagnosis not present

## 2017-02-12 NOTE — ED Notes (Signed)
Have called patient's name twice in lobby. No response.

## 2017-02-12 NOTE — ED Notes (Signed)
Patient left without being seen by EDP. No distress noted. No coughing noted. EDP notified.

## 2017-02-12 NOTE — ED Notes (Signed)
See EDP secondary assessment.  

## 2017-02-12 NOTE — ED Notes (Signed)
Before RN could even as patient questions for assessment, patient began asking about food - "you got any food back here? I ain't ate since yesterday." Patient sleeping in chair when RN entered room, and when RN left room.

## 2017-02-12 NOTE — ED Triage Notes (Signed)
Pt was sleeping in the waiting area, upon waking pt states that he wanted to be checked in for a cough, productive with green phelm and a sore throat. Denies fevers.

## 2018-02-22 ENCOUNTER — Other Ambulatory Visit: Payer: Self-pay

## 2018-02-22 ENCOUNTER — Emergency Department (HOSPITAL_COMMUNITY)
Admission: EM | Admit: 2018-02-22 | Discharge: 2018-02-22 | Disposition: A | Discharge status: Home / Self Care | Payer: Medicare Other

## 2018-02-22 NOTE — ED Notes (Signed)
Called pt x2 no answer

## 2019-05-18 ENCOUNTER — Emergency Department (HOSPITAL_COMMUNITY)
Admission: EM | Admit: 2019-05-18 | Discharge: 2019-05-19 | Disposition: A | Discharge status: Home / Self Care | Payer: Medicare Other | Attending: Emergency Medicine | Admitting: Emergency Medicine

## 2019-05-18 ENCOUNTER — Emergency Department (HOSPITAL_COMMUNITY): Payer: Medicare Other

## 2019-05-18 ENCOUNTER — Encounter (HOSPITAL_COMMUNITY): Payer: Self-pay

## 2019-05-18 ENCOUNTER — Other Ambulatory Visit: Payer: Self-pay

## 2019-05-18 DIAGNOSIS — T50992A Poisoning by other drugs, medicaments and biological substances, intentional self-harm, initial encounter: Secondary | ICD-10-CM | POA: Insufficient documentation

## 2019-05-18 DIAGNOSIS — S91322A Laceration with foreign body, left foot, initial encounter: Secondary | ICD-10-CM | POA: Diagnosis present

## 2019-05-18 DIAGNOSIS — Y280XXA Contact with sharp glass, undetermined intent, initial encounter: Secondary | ICD-10-CM | POA: Insufficient documentation

## 2019-05-18 DIAGNOSIS — X838XXA Intentional self-harm by other specified means, initial encounter: Secondary | ICD-10-CM | POA: Diagnosis not present

## 2019-05-18 DIAGNOSIS — Y9389 Activity, other specified: Secondary | ICD-10-CM | POA: Insufficient documentation

## 2019-05-18 DIAGNOSIS — R451 Restlessness and agitation: Secondary | ICD-10-CM | POA: Diagnosis not present

## 2019-05-18 DIAGNOSIS — Z23 Encounter for immunization: Secondary | ICD-10-CM | POA: Insufficient documentation

## 2019-05-18 DIAGNOSIS — F17210 Nicotine dependence, cigarettes, uncomplicated: Secondary | ICD-10-CM | POA: Insufficient documentation

## 2019-05-18 DIAGNOSIS — Y999 Unspecified external cause status: Secondary | ICD-10-CM | POA: Diagnosis not present

## 2019-05-18 DIAGNOSIS — Z20822 Contact with and (suspected) exposure to covid-19: Secondary | ICD-10-CM | POA: Diagnosis not present

## 2019-05-18 DIAGNOSIS — T6592XA Toxic effect of unspecified substance, intentional self-harm, initial encounter: Secondary | ICD-10-CM

## 2019-05-18 DIAGNOSIS — Y929 Unspecified place or not applicable: Secondary | ICD-10-CM | POA: Diagnosis not present

## 2019-05-18 DIAGNOSIS — S91332A Puncture wound without foreign body, left foot, initial encounter: Secondary | ICD-10-CM

## 2019-05-18 DIAGNOSIS — T1491XA Suicide attempt, initial encounter: Secondary | ICD-10-CM

## 2019-05-18 LAB — CBC WITH DIFFERENTIAL/PLATELET
Abs Immature Granulocytes: 0.03 10*3/uL (ref 0.00–0.07)
Basophils Absolute: 0 10*3/uL (ref 0.0–0.1)
Basophils Relative: 0 %
Eosinophils Absolute: 0 10*3/uL (ref 0.0–0.5)
Eosinophils Relative: 0 %
HCT: 45.6 % (ref 39.0–52.0)
Hemoglobin: 15.6 g/dL (ref 13.0–17.0)
Immature Granulocytes: 0 %
Lymphocytes Relative: 11 %
Lymphs Abs: 1.2 10*3/uL (ref 0.7–4.0)
MCH: 31.9 pg (ref 26.0–34.0)
MCHC: 34.2 g/dL (ref 30.0–36.0)
MCV: 93.3 fL (ref 80.0–100.0)
Monocytes Absolute: 0.8 10*3/uL (ref 0.1–1.0)
Monocytes Relative: 7 %
Neutro Abs: 8.8 10*3/uL — ABNORMAL HIGH (ref 1.7–7.7)
Neutrophils Relative %: 82 %
Platelets: 247 10*3/uL (ref 150–400)
RBC: 4.89 MIL/uL (ref 4.22–5.81)
RDW: 11.8 % (ref 11.5–15.5)
WBC: 10.8 10*3/uL — ABNORMAL HIGH (ref 4.0–10.5)
nRBC: 0 % (ref 0.0–0.2)

## 2019-05-18 LAB — COMPREHENSIVE METABOLIC PANEL
ALT: 27 U/L (ref 0–44)
AST: 53 U/L — ABNORMAL HIGH (ref 15–41)
Albumin: 4.6 g/dL (ref 3.5–5.0)
Alkaline Phosphatase: 42 U/L (ref 38–126)
Anion gap: 13 (ref 5–15)
BUN: 16 mg/dL (ref 6–20)
CO2: 24 mmol/L (ref 22–32)
Calcium: 9.4 mg/dL (ref 8.9–10.3)
Chloride: 101 mmol/L (ref 98–111)
Creatinine, Ser: 1.28 mg/dL — ABNORMAL HIGH (ref 0.61–1.24)
GFR calc Af Amer: >60 mL/min (ref >60)
GFR calc non Af Amer: >60 mL/min (ref >60)
Glucose, Bld: 95 mg/dL (ref 70–99)
Potassium: 3.3 mmol/L — ABNORMAL LOW (ref 3.5–5.1)
Sodium: 138 mmol/L (ref 135–145)
Total Bilirubin: 1.6 mg/dL — ABNORMAL HIGH (ref 0.3–1.2)
Total Protein: 7.5 g/dL (ref 6.5–8.1)

## 2019-05-18 LAB — RESPIRATORY PANEL BY RT PCR (FLU A&B, COVID)
Influenza A by PCR: NEGATIVE
Influenza B by PCR: NEGATIVE
SARS Coronavirus 2 by RT PCR: NEGATIVE

## 2019-05-18 MED ORDER — LORAZEPAM 2 MG/ML IJ SOLN
INTRAMUSCULAR | Status: AC
  Filled 2019-05-18: qty 1

## 2019-05-18 MED ORDER — ZIPRASIDONE MESYLATE 20 MG IM SOLR
20.0000 mg | Freq: Once | INTRAMUSCULAR | Status: AC
  Administered 2019-05-18: 21:00:00 20 mg via INTRAMUSCULAR

## 2019-05-18 MED ORDER — LORAZEPAM 2 MG/ML IJ SOLN
2.0000 mg | Freq: Once | INTRAMUSCULAR | Status: AC
  Administered 2019-05-18: 21:00:00 2 mg via INTRAVENOUS

## 2019-05-18 MED ORDER — TETANUS-DIPHTH-ACELL PERTUSSIS 5-2.5-18.5 LF-MCG/0.5 IM SUSP
0.5000 mL | Freq: Once | INTRAMUSCULAR | Status: AC
  Administered 2019-05-18: 0.5 mL via INTRAMUSCULAR
  Filled 2019-05-18: qty 0.5

## 2019-05-18 MED ORDER — BACITRACIN ZINC 500 UNIT/GM EX OINT
1.0000 "application " | TOPICAL_OINTMENT | Freq: Two times a day (BID) | CUTANEOUS | Status: DC
  Administered 2019-05-18: 1 via TOPICAL
  Filled 2019-05-18: qty 0.9

## 2019-05-18 NOTE — ED Triage Notes (Signed)
Pt arrived via [facility] w/police escort for throwing bricks at window. 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 Then pt locked self in our bathroom and a bottle of no-rinse cleanser and tied a trash bag over his head trying to suffocate himself. Pt came in yelling and cursing at the police the entire time.

## 2019-05-18 NOTE — ED Notes (Signed)
Patient resting comfortable with eyes closed.  NAD.  Police officer sitting outside of room

## 2019-05-18 NOTE — ED Notes (Signed)
Patient transported to X-ray 

## 2019-05-18 NOTE — ED Provider Notes (Signed)
[facility] EMERGENCY DEPARTMENT Provider Note   CSN: [id number] Arrival date & time: 05/18/19  2039     History Chief Complaint  Patient presents with  . Wound on left foot    Mathew Cooke is a 24 y.o. male.  HPI   This patient is a 24 year old male, he has a history of no known medical problems in fact when I reviewed the medical history it appears that he has had emergency department visits in the past after minor illnesses or injuries in fact 1 of those he intentionally ingested 4 ounces of a cleaning solution.  He presents today in the care of the police officers after he was arrested for throwing bricks at his girlfriend and mother's window.  He reportedly had a falling out with them and has a history of becoming agitated aggressive and violent when he becomes upset.  It is unclear whether he had been using drugs or alcohol earlier in the day and when he was arrested and brought to the jail it was found that on his left foot he had a small laceration that had a small amount of blood.  There was also a concern that he had multiple drugs on board and hence he was brought to the emergency department for evaluation.  On arrival the patient is agitated combative yelling screaming fighting and was found to have ingested a complete 4 ounces of a cleaning solution that was in the bathroom when he was left unattended.  The patient is not willing to give me any information but continues to state that he does not want to live anymore.  Level 5 caveat applies as the patient has been sedated with Geodon and is not forthcoming with information.  History reviewed. No pertinent past medical history.  Patient Active Problem List   Diagnosis Date Noted  . First known suicide attempt (HCC) 01/27/2015    History reviewed. No pertinent surgical history.     No family history on file.  Social History   Tobacco Use  . Smoking status: Current Every Day Smoker   Packs/day: 1.00    Types: Cigarettes  . Smokeless tobacco: Never Used  Substance Use Topics  . Alcohol use: Not Currently    Comment: Ocassionally  . Drug use: Yes    Types: Marijuana, Cocaine    Comment: heroin    Home Medications Prior to Admission medications   Medication Sig Start Date End Date Taking? Authorizing Provider  diphenhydramine-acetaminophen (TYLENOL PM) 25-500 MG TABS tablet Take 1 tablet by mouth at bedtime as needed (sleep/pain).    [provider]  sulfamethoxazole-trimethoprim (BACTRIM DS,SEPTRA DS) 800-160 MG tablet Take 1 tablet by mouth 2 (two) times daily. 12/10/15   Street, [place], PA-C    Allergies    Patient has no known allergies.  Review of Systems   Review of Systems  All other systems reviewed and are negative.   Physical Exam Updated Vital Signs BP 136/87   Pulse (!) 106   Temp 98.5 F (36.9 C) (Oral)   Resp 20   Ht 1.702 m (5\' 7" )   Wt 63.5 kg   SpO2 98%   BMI 21.93 kg/m   Physical Exam Vitals and nursing note reviewed.  Constitutional:      General: He is not in acute distress.    Appearance: He is well-developed.     Comments: Somnolent but easily arousable  HENT:     Head: Normocephalic and atraumatic.     Mouth/Throat:  Mouth: Mucous membranes are moist.     Pharynx: No oropharyngeal exudate.  Eyes:     General: No scleral icterus.       Right eye: No discharge.        Left eye: No discharge.     Conjunctiva/sclera: Conjunctivae normal.     Pupils: Pupils are equal, round, and reactive to light.  Neck:     Thyroid: No thyromegaly.     Vascular: No JVD.  Cardiovascular:     Rate and Rhythm: Normal rate and regular rhythm.     Heart sounds: Normal heart sounds. No murmur. No friction rub. No gallop.   Pulmonary:     Effort: Pulmonary effort is normal. No respiratory distress.     Breath sounds: Normal breath sounds. No wheezing or rales.  Abdominal:     General: Bowel sounds are normal. There is no  distension.     Palpations: Abdomen is soft. There is no mass.     Tenderness: There is no abdominal tenderness.  Musculoskeletal:        General: No tenderness. Normal range of motion.     Cervical back: Normal range of motion and neck supple.  Lymphadenopathy:     Cervical: No cervical adenopathy.  Skin:    General: Skin is warm and dry.     Findings: No erythema or rash.     Comments: Laceration noted to the bottom of the left foot, 1 is on the plantar aspect of the other at the base of the fifth toe.  The laceration at the base of the fifth toe contains a foreign body, a piece of glass was removed, please see per separate foreign body removal note  Neurological:     Coordination: Coordination normal.     Comments: The patient moves all 4 extremities, he curses eloquently, he is able to follow simple commands but is resistant to doing so and oppositional  Psychiatric:     Comments: The patient appears depressed and endorses suicide     ED Results / Procedures / Treatments   Labs (all labs ordered are listed, but only abnormal results are displayed) Labs Reviewed - No data to display  EKG EKG Interpretation  Date/Time:  Saturday May 18 2019 21:03:53 EST Ventricular Rate:  107 PR Interval:    QRS Duration: 84 QT Interval:  344 QTC Calculation: 459 R Axis:   78 Text Interpretation: Sinus tachycardia ST elev, probable normal early repol pattern Since last tracing rate faster Confirmed by Noemi Chapel [phone]) on 05/18/2019 9:07:57 PM   Radiology DG Foot Complete Left  Result Date: 05/18/2019 CLINICAL DATA:  Foreign body and laceration. Wound about the fifth digit. EXAM: LEFT FOOT - COMPLETE 3+ VIEW COMPARISON:  None. FINDINGS: There is no evidence of fracture or dislocation. There is no evidence of arthropathy or other focal bone abnormality. No bony destructive change or periosteal reaction. Site of laceration is not well-defined by radiograph. No soft tissue air or  radiopaque foreign body. IMPRESSION: Site of laceration not well-defined by radiograph. No radiopaque foreign body or soft tissue air. No osseous abnormalities. Electronically Signed   By: Keith Rake M.D.   On: 05/18/2019 22:20    Procedures .Foreign Body Removal  Date/Time: 05/18/2019 9:46 PM Performed by: Noemi Chapel, MD Authorized by: Noemi Chapel, MD  Consent: The procedure was performed in an emergent situation. Patient identity confirmed: verbally with patient Time out: Immediately prior to procedure a "time out" was called to verify the  correct patient, procedure, equipment, support staff and site/side marked as required. Body area: skin General location: lower extremity Location details: left foot  Sedation: Patient sedated: no  Patient cooperative: yes Localization method: visualized Removal mechanism: forceps Dressing: dressing applied and antibiotic ointment Tendon involvement: none Depth: subcutaneous Complexity: simple 1 objects recovered. Objects recovered: glass Post-procedure assessment: foreign body removed Patient tolerance: patient tolerated the procedure well with no immediate complications Comments:     (including critical care time)  Medications Ordered in ED Medications  ziprasidone (GEODON) injection 20 mg (has no administration in time range)  LORazepam (ATIVAN) injection 2 mg (has no administration in time range)  LORazepam (ATIVAN) 2 MG/ML injection (has no administration in time range)    ED Course  I have reviewed the triage vital signs and the nursing notes.  Pertinent labs & imaging results that were available during my care of the patient were reviewed by me and considered in my medical decision making (see chart for details).    MDM Rules/Calculators/A&P                      The patient ingested one 4 ounce bottle of a bedside care spray which is found to be an irritant.  I discussed the case with the [facility]  who was able to find this exact product and stated that this should only cause some nausea vomiting or upset stomach.  They recommended Ativan for agitation, fluids as needed, EKG and look for coingestants.  This patient will be placed on a monitor, labs will be drawn with an EKG,  EKG reviewed and is totally normal other than a mild sinus tachycardia  The patient will go in custody once he is medically cleared.  Final Clinical Impression(s) / ED Diagnoses Final diagnoses:  Penetrating wound of left foot, initial encounter  Suicide attempt (HCC)  Ingestion of nontoxic substance, intentional self-harm, initial encounter (HCC)      Eber Hong, MD 05/19/19 [phone]

## 2019-05-18 NOTE — ED Notes (Signed)
I cleaned wound w/sterile water and applied dry dressing

## 2019-05-18 NOTE — ED Notes (Signed)
Pt has 0.3cm lac on left foot medial foot.

## 2019-05-18 NOTE — ED Notes (Signed)
I discontinued 4 point restraints, pt is sleeping and is calm

## 2019-05-19 DIAGNOSIS — S91322A Laceration with foreign body, left foot, initial encounter: Secondary | ICD-10-CM | POA: Diagnosis not present

## 2019-05-19 LAB — ACETAMINOPHEN LEVEL: Acetaminophen (Tylenol), Serum: <10 ug/mL — ABNORMAL LOW (ref 10–30)

## 2019-05-19 LAB — SALICYLATE LEVEL: Salicylate Lvl: <7 mg/dL — ABNORMAL LOW (ref 7.0–30.0)

## 2019-05-19 LAB — ETHANOL: Alcohol, Ethyl (B): <10 mg/dL (ref <10)

## 2019-05-19 NOTE — Progress Notes (Signed)
Orthopedic Tech Progress Note Patient Details:  Mathew Cooke [birth year] [id number]  Ortho Devices Type of Ortho Device: CAM walker Ortho Device/Splint Location: lle Ortho Device/Splint Interventions: Ordered, Application, Adjustment   Post Interventions Patient Tolerated: Well Instructions Provided: Care of device, Adjustment of device   [name] 05/19/2019, 6:36 AM

## 2019-05-19 NOTE — ED Notes (Signed)
Currently awaiting CAM walker to ambulate patient.  MD aware

## 2019-05-19 NOTE — ED Notes (Signed)
Patient ambulated around room.  States no pain with walking boot.  MD notified

## 2019-05-19 NOTE — ED Notes (Signed)
Patient states he is unable to sign for discharge due to the handcuffs present

## 2019-05-19 NOTE — ED Notes (Signed)
Ortho tech here to place CAM walker

## 2019-05-19 NOTE — ED Provider Notes (Signed)
I assumed care of this patient from Dr. Hyacinth Meeker.  Please see their note for further details of Hx, PE.  Briefly patient is a 24 y.o. male who presented by [employer]. Foot wound FB removed. Apparent suicide attempt in the ED by ingesting soap. Required Geodon. Pending medical clearance.     Labs reassuring and vitals remained stable. Patient provided with a cam walker Allowed to metabolize Geodon.  Patient now awake and ambulatory. Will be released to the custody of [employer] with suicide precautions  The patient appears reasonably screened and/or stabilized for discharge and I doubt any other medical condition or other [facility] requiring further screening, evaluation, or treatment in the ED at this time prior to discharge.  The patient is safe for discharge with strict return precautions.   The patient appears reasonably screened and/or stabilized for discharge and I doubt any other medical condition or other [facility] requiring further screening, evaluation, or treatment in the ED at this time prior to discharge.  Disposition: Discharge  Condition: Good  I have discussed the results, Dx and Tx plan with the patient who expressed understanding and agree(s) with the plan. Discharge instructions discussed at great length. The patient was given strict return precautions who verbalized understanding of the instructions. No further questions at time of discharge.    ED Discharge Orders    None         Rasmus Preusser, Amadeo Garnet, MD 05/19/19 [phone]

## 2019-05-19 NOTE — Discharge Instructions (Addendum)
Patient will require suicide precautions. Foot wound will need soapy water rinses 3 times per day with inspections and dressing change for at least 5 days.  Weight bearing as tolerated. Tylenol for pain.

## 2020-03-30 ENCOUNTER — Other Ambulatory Visit: Payer: Self-pay

## 2020-03-30 ENCOUNTER — Encounter (HOSPITAL_COMMUNITY): Payer: Self-pay | Admitting: Registered Nurse

## 2020-03-30 ENCOUNTER — Ambulatory Visit (HOSPITAL_COMMUNITY)
Admission: EM | Admit: 2020-03-30 | Discharge: 2020-03-30 | Disposition: A | Discharge status: Home / Self Care | Payer: Medicare Other | Attending: Registered Nurse | Admitting: Registered Nurse

## 2020-03-30 DIAGNOSIS — Z8659 Personal history of other mental and behavioral disorders: Secondary | ICD-10-CM

## 2020-03-30 DIAGNOSIS — F209 Schizophrenia, unspecified: Secondary | ICD-10-CM | POA: Diagnosis not present

## 2020-03-30 DIAGNOSIS — F4323 Adjustment disorder with mixed anxiety and depressed mood: Secondary | ICD-10-CM | POA: Diagnosis present

## 2020-03-30 DIAGNOSIS — R45851 Suicidal ideations: Secondary | ICD-10-CM | POA: Diagnosis not present

## 2020-03-30 MED ORDER — OLANZAPINE 5 MG PO TABS: 5.0000 mg | ORAL_TABLET | Freq: Every day | ORAL | Status: DC

## 2020-03-30 MED ORDER — HYDROXYZINE HCL 25 MG PO TABS: 25.0000 mg | ORAL_TABLET | Freq: Three times a day (TID) | ORAL | Status: DC | PRN

## 2020-03-30 MED ORDER — TRAZODONE HCL 50 MG PO TABS: 50.0000 mg | ORAL_TABLET | Freq: Every evening | ORAL | Status: DC | PRN

## 2020-03-30 NOTE — ED Provider Notes (Addendum)
Behavioral Health Admission [facility] & OBS)  Date: 03/30/20 Patient Name: Mathew Cooke MRN: [id number] Chief Complaint:  Chief Complaint  Patient presents with  . Depression  . Suicidal      Diagnoses:  Final diagnoses:  Adjustment disorder with mixed anxiety and depressed mood  History of schizoaffective disorder    HPI: Mathew Cooke, 24 y.o., male patient presents to [facility] as walk in via law enforcement with complaints of depression and suicidal ideation. Patient seen face to face by this provider, consulted with Dr. Serafina Mitchell; and chart reviewed on 03/30/20.  On evaluation [name] reports he tried to hang himself with a sheet.  States he tied sheet to ceiling fan but broke because he did not tie it right.  Patient reports a prior suicide attempt when in jail via hanging also.  Patient states he and his girlfriend broke up today and "I told her if she every left me I was going to kill myself cause I don't want to be wit nobody else."  Patient states he lives with his mother, unemployed "hustle" to make money.  States that he has never been admitted to psychiatric hospital but has been diagnosed with schizoaffective disorder ans was treated with Zyprexa while in jail.  States he has been out of jail for a year now.  States he doesn't currently have outpatient psychiatric services.   During evaluation [name] is alert/oriented x 4; calm/cooperative; and mood is congruent with affect.  He does not appear to be responding to internal/external stimuli or delusional thoughts.  Patient denies homicidal ideation and paranoia; reports auditory hallucination sometimes; doesn't say that he is hearing voices at this time "unless I focus on one point then I'll hear them.  Patient also states he has a problem with dyslexia and difficulty reading.  States another stressor is the belittling by his girlfriend.  Patient answered question appropriately.    PHQ 2-9:     Total  Time spent with patient: 45 minutes  Musculoskeletal  Strength & Muscle Tone: within normal limits Gait & Station: normal Patient leans: N/A  Psychiatric Specialty Exam  Presentation General Appearance: Appropriate for Environment;Casual  Eye Contact:Fair  Speech:Clear and Coherent;Normal Rate  Speech Volume:Normal  Handedness:Right   Mood and Affect  Mood:Anxious;Depressed  Affect:Congruent;Depressed   Thought Process  Thought Processes:Coherent;Goal Directed  Descriptions of Associations:Intact  Orientation:Full (Time, Place and Person)  Thought Content:WDL  Hallucinations:No data recorded Ideas of Reference:None  Suicidal Thoughts:Suicidal Thoughts: Yes, Active SI Active Intent and/or Plan: With Intent;With Plan;With Means to [employer] (Reports he tried to hang himself with a sheet but "I didn't tie it right and it broke")  Homicidal Thoughts:No data recorded  Sensorium  Memory:Immediate Good;Recent Good;Remote Good  Judgment:Fair  Insight:Lacking   Executive Functions  Concentration:Good  Attention Span:Good  [place]   Psychomotor Activity  Psychomotor Activity:Psychomotor Activity: Normal   Assets  Assets:Communication Skills;Desire for Improvement;Housing;Social Support   Sleep  Sleep:Sleep: Good   Physical Exam ROS  Blood pressure 131/76, pulse 98, temperature 98.7 F (37.1 C), temperature source Oral, resp. rate 18, height _0  (1.702 m), SpO2 96 %. There is no height or weight on file to calculate BMI.  Past Psychiatric History: Schizoaffective disorder, substance abuse   Is the patient at risk to self? Yes  Has the patient been a risk to self in the past 6 months? Yes .    Has the patient been a risk to self  within the distant past? No   Is the patient a risk to others? No   Has the patient been a risk to others in the past 6 months? No   Has the patient been a risk to others  within the distant past? No   Past Medical History: History reviewed. No pertinent past medical history. History reviewed. No pertinent surgical history.  Family History: History reviewed. No pertinent family history.  Social History:  Social History   Socioeconomic History  . Marital status: Single    Spouse name: Not on file  . Number of children: Not on file  . Years of education: Not on file  . Highest education level: Not on file  Occupational History  . Not on file  Tobacco Use  . Smoking status: Not on file  Substance and Sexual Activity  . Alcohol use: Not on file  . Drug use: Not on file  . Sexual activity: Not on file  Other Topics Concern  . Not on file  Social History Narrative  . Not on file   Social Determinants of Health   Financial Resource Strain:   . Difficulty of Paying Living Expenses: Not on file  Food Insecurity:   . Worried About Charity fundraiser in the Last Year: Not on file  . Ran Out of Food in the Last Year: Not on file  Transportation Needs:   . Lack of Transportation (Medical): Not on file  . Lack of Transportation (Non-Medical): Not on file  Physical Activity:   . Days of Exercise per Week: Not on file  . Minutes of Exercise per Session: Not on file  Stress:   . Feeling of Stress : Not on file  Social Connections:   . Frequency of Communication with Friends and Family: Not on file  . Frequency of Social Gatherings with Friends and Family: Not on file  . Attends Religious Services: Not on file  . Active Member of Clubs or Organizations: Not on file  . Attends Archivist Meetings: Not on file  . Marital Status: Not on file  Intimate Partner Violence:   . Fear of Current or Ex-Partner: Not on file  . Emotionally Abused: Not on file  . Physically Abused: Not on file  . Sexually Abused: Not on file    SDOH:  SDOH Screenings   Alcohol Screen:   . Last Alcohol Screening Score (AUDIT): Not on file  Depression (PHQ2-9):    . PHQ-2 Score: Not on file  Financial Resource Strain:   . Difficulty of Paying Living Expenses: Not on file  Food Insecurity:   . Worried About Charity fundraiser in the Last Year: Not on file  . Ran Out of Food in the Last Year: Not on file  Housing:   . Last Housing Risk Score: Not on file  Physical Activity:   . Days of Exercise per Week: Not on file  . Minutes of Exercise per Session: Not on file  Social Connections:   . Frequency of Communication with Friends and Family: Not on file  . Frequency of Social Gatherings with Friends and Family: Not on file  . Attends Religious Services: Not on file  . Active Member of Clubs or Organizations: Not on file  . Attends Archivist Meetings: Not on file  . Marital Status: Not on file  Stress:   . Feeling of Stress : Not on file  Tobacco Use:   . Smoking Tobacco Use: Not  on file  . Smokeless Tobacco Use: Not on file  Transportation Needs:   . Lack of Transportation (Medical): Not on file  . Lack of Transportation (Non-Medical): Not on file    Last Labs:  No results found for any previous visit.    Allergies: Patient has no allergy information on record.  PTA Medications: (Not in a hospital admission)   Medical Decision Making  Patient admitted to Continuous Assessment Unit Routine labs and EKG ordered  Lab Orders     Resp Panel by RT-PCR (Flu A&B, Covid) Nasopharyngeal Swab     CBC with Differential/Platelet     Comprehensive metabolic panel     Hemoglobin A1c     Magnesium     Ethanol     Lipid panel     TSH     Urinalysis, Routine w reflex microscopic Urine, Clean Catch     POC SARS Coronavirus 2 Ag-ED - Nasal Swab (BD Veritor Kit)     POCT Urine Drug Screen - (ICup)   Medication Management  Meds ordered this encounter  Medications  trazodone (DESYREL) tablet 50 mg Q hs prn sleep  hydroxyzine (ATARAX/VISTARIL) tablet 25 mg Tid prn anxiety  Olanzapine (ZYPREXA) tablet 5 mg Q hs MDD/hallucinations    Social work ordered to work with patient setting up for outpatient psychiatric services medication management and therapy once discharged.   Recommendations  Based on my evaluation the patient does not appear to have an emergency medical condition.  Addendum:    Patient states he is able to contract for safety and that he no longer wants to kill himself; states he acted in the heat of the moment.  Patient gave permission to speak with his cousins who are here for support.  Both state that they feel that patient will be safe at their home and that someone will be with patient at all times.  Discussed safety plan Patient will stay at the home of his cousin. If he start to have suicidal thoughts will talk to his cousins, call 911, or mobile crisis Patient will be at [facility] for open access to do intake for outpatient psychiatric services.  No medication will be started at this time related to patient not wanting medication feels he doesn't need it and medications make him feel worse.    The suicide prevention education provided includes the following:  Suicide risk factors  Suicide prevention and interventions  National Suicide Hotline telephone number  [facility] assessment telephone number  [facility] Emergency Assistance [place] and/or Residential Mobile Crisis Unit telephone number   Request made of family/significant other to:  Remove weapons (e.g., guns, rifles, knives), all items previously/currently identified as safety concern.   Remove drugs/medications (over the counter, prescriptions, illicit drugs), all items previously/currently identified as a safety concern.  Patient discharged to Follow up at [place]. Go on 03/31/2020.   Specialty: Urgent Care Why: Hours Monday - Thursday from 8:00 am to 11:00 am (Open Access) for intake medication management and Therapy.  First come  first Midwife information: Murdo [place] Greely Atiyeh, NP 03/30/20  2:11 PM

## 2020-03-30 NOTE — ED Triage Notes (Signed)
24 yo male bought in by [facility] stating pt called them at his home stating he just attempted to hang himself. Pt states, "I wrapped a sheet around my neck but I fell out of the chair because my girlfriend was threatening to leave me. She is always leaving me but come back later. I just wanted her to stop". Denies SI/HI/AVH. No markings or bruising observed on pt neck. Denies difficulty swallowing.

## 2020-03-30 NOTE — ED Notes (Signed)
LOCKER #[id number]

## 2020-03-30 NOTE — BH Assessment (Signed)
Comprehensive Clinical Assessment (CCA) Note  03/30/2020 Mathew Cooke [id number]  Mathew Cooke is a 24 year old male presenting to [facility] voluntarily by [employer] with chief complaint of a failed suicide attempt. Patient reports arguing with his girlfriend for the past three days. Patient states that he told his girlfriend when they first started dating if she was to ever leave him, he would kill himself. Patient states that when him and his girlfriend usually argue, she would leave and come back. Patient reports that his girlfriend left the house Saturday night, and she did not return today so he took a sheet tied one end of it to his neck and the other to the ceiling fan in an attempt to kill himself. Patient reports that the sheet came undone. Patient reports calling his mother to let her know what happened but one of his mother co-workers answered and they called 911.    Patient reports past diagnosis of bipolar and schizophrenia disorder. Patient also reports diagnosis of dyslexia and states that he is unable to read or write. Patient reports dropping out of school in the 9th grade. Patient reports history of medications in past but is not taking any medications or receiving any outpatient services at this time. Patient reports history of criminal charges to include breaking and entering. Patient denies current charges and probation. Patient reports one prior suicide attempt in jail by ingesting some type of chemical. Patient reports smoking 2 grams of marijuana daily and infrequent alcohol use. Patient gives clinician consent to obtain collateral information from his mother Mathew Cooke [phone].   Clinician attempted to call patient mother, but she did not answer. A HIPPA compliant message was left on voicemail.   Patient cousin Mathew Cooke and Mathew Cooke presented to [facility] to support patient. Cousins reports that patient called them after trying to call his mother and since both work in [place] it took  them awhile to get back to [place]. Cousins reports that patient has some developmental delay and has difficulty processing his emotions. Clinician shared with patient cousins the recommendations from NP and about the process of overnight observation and inpatient treatment. Clinician and LPN shared IVC process if patient did not want to stay at facility voluntarily and if he was not able to safety plan with them to return home. Both cousins wanted to speak with patient about suicide attempt. Patient cousins are willing to let patient stay with them for 24 hours and will be able to monitor patient until he is able to receive outpatient services at [facility]. Patient cousin is willing to bring patient back tomorrow for open access. Safety planning completed with patient, his two cousins, NP and counselor.     Patient is oriented to person and time, he is alert, engaged and cooperative. Patient presents with articulation difficulty and somewhat slurred, but tone of voice is normal. Patient eye contact is avoidant, and his mood is somewhat anxious. Patient reports failed suicide attempt and reports history of hearing voices telling him ways to kill himself in past when he was taking medications. Patient denies HI/AVH and SIB.   Disposition: Per Shuvon Rankin, NP, patient is recommended for discharge after collateral information obtained from family and safety planning completed.   Chief Complaint:  Chief Complaint  Patient presents with  . Depression  . Suicidal   Visit Diagnosis:  Adjustment disorder with mixed anxiety and depressed mood  History of schizoaffective disorder       CCA Screening, Triage and Referral (STR)  Patient Reported Information How  did you hear about [place]? No data recorded Referral name: No data recorded Referral phone number: No data recorded  Whom do you see for routine medical problems? No data recorded Practice/Facility Name: No data recorded Practice/Facility Phone  Number: No data recorded Name of Contact: No data recorded Contact Number: No data recorded Contact Fax Number: No data recorded Prescriber Name: No data recorded Prescriber Address (if known): No data recorded  What Is the Reason for Your Visit/Call Today? No data recorded How Long Has This Been Causing You Problems? No data recorded What Do You Feel Would Help You the Most Today? No data recorded  Have You Recently Been in Any Inpatient Treatment (Hospital/Detox/Crisis Center/28-Day Program)? No data recorded Name/Location of Program/Hospital:No data recorded How Long Were You There? No data recorded When Were You Discharged? No data recorded  Have You Ever Received Services From [facility] Before? No data recorded Who Do You See at [facility]? No data recorded  Have You Recently Had Any Thoughts About Hurting Yourself? No data recorded Are You Planning to Commit Suicide/Harm Yourself At This time? No data recorded  Have you Recently Had Thoughts About Hurting Someone Karolee Ohs? No data recorded Explanation: No data recorded  Have You Used Any Alcohol or Drugs in the Past 24 Hours? No data recorded How Long Ago Did You Use Drugs or Alcohol? No data recorded What Did You Use and How Much? No data recorded  Do You Currently Have a Therapist/Psychiatrist? No data recorded Name of Therapist/Psychiatrist: No data recorded  Have You Been Recently Discharged From Any Office Practice or Programs? No data recorded Explanation of Discharge From Practice/Program: No data recorded    CCA Screening Triage Referral Assessment Type of Contact: No data recorded Is this Initial or Reassessment? No data recorded Date Telepsych consult ordered in [facility]:  No data recorded Time Telepsych consult ordered in [facility]:  No data recorded  Patient Reported Information Reviewed? No data recorded Patient Left Without Being Seen? No data recorded Reason for Not Completing Assessment: No data  recorded  Collateral Involvement: No data recorded  Does Patient Have a Court Appointed Legal Guardian? No data recorded Name and Contact of Legal Guardian: No data recorded If Minor and Not Living with Parent(s), Who has Custody? No data recorded Is CPS involved or ever been involved? No data recorded Is APS involved or ever been involved? No data recorded  Patient Determined To Be At Risk for Harm To Self or Others Based on Review of Patient Reported Information or Presenting Complaint? No data recorded Method: No data recorded Availability of Means: No data recorded Intent: No data recorded Notification Required: No data recorded Additional Information for Danger to Others Potential: No data recorded Additional Comments for Danger to Others Potential: No data recorded Are There Guns or Other Weapons in Your Home? No data recorded Types of Guns/Weapons: No data recorded Are These Weapons Safely Secured?                            No data recorded Who Could Verify You Are Able To Have These Secured: No data recorded Do You Have any Outstanding Charges, Pending Court Dates, Parole/Probation? No data recorded Contacted To Inform of Risk of Harm To Self or Others: No data recorded  Location of Assessment: No data recorded  Does Patient Present under Involuntary Commitment? No data recorded IVC Papers Initial File Date: No data recorded  [place] of Residence:  No data recorded  Patient Currently Receiving the Following Services: No data recorded  Determination of Need: No data recorded  Options For Referral: No data recorded    CCA Biopsychosocial Intake/Chief Complaint:  SI with attempt  Current Symptoms/Problems: No data recorded  Patient Reported Schizophrenia/Schizoaffective Diagnosis in Past: Yes   Strengths: UTA  Preferences: UTA  Abilities: UTA   Type of Services Patient Feels are Needed: No data recorded  Initial Clinical Notes/Concerns: No data  recorded  Mental Health Symptoms Depression:  Tearfulness   Duration of Depressive symptoms: Less than two weeks   Mania:  None   Anxiety:   None   Psychosis:  Hallucinations   Duration of Psychotic symptoms: Less than six months   Trauma:  None   Obsessions:  None   Compulsions:  None   Inattention:  None   Hyperactivity/Impulsivity:  N/A   Oppositional/Defiant Behaviors:  None   Emotional Irregularity:  None   Other Mood/Personality Symptoms:  No data recorded   Mental Status Exam Appearance and self-care  Stature:  Average   Weight:  Average weight   Clothing:  Age-appropriate   Grooming:  Normal   Cosmetic use:  None   Posture/gait:  Normal   Motor activity:  Not Remarkable   Sensorium  Attention:  Normal   Concentration:  Normal   Orientation:  Person;Time   Recall/memory:  Normal   Affect and Mood  Affect:  Anxious   Mood:  Anxious   Relating  Eye contact:  Avoided   Facial expression:  Sad   Attitude toward examiner:  Cooperative   Thought and Language  Speech flow: Articulation error;Garbled   Thought content:  Appropriate to Mood and Circumstances   Preoccupation:  None   Hallucinations:  None   Organization:  No data recorded  [employer] of Knowledge:  Fair   Intelligence:  Needs investigation   Abstraction:  No data recorded  Judgement:  Impaired   Reality Testing:  Adequate   Insight:  Poor   Decision Making:  Impulsive   Social Functioning  Social Maturity:  Impulsive   Social Judgement:  "Street Smart"   Stress  Stressors:  Relationship   Coping Ability:  Exhausted   Skill Deficits:  Intellect/education   Supports:  Family     Religion:    Leisure/Recreation:    Exercise/Diet:     CCA Employment/Education Employment/Work Situation: Employment / Work Situation Employment situation: Unemployed Has patient ever been in the [employer]?: No  Education: Education Is  Patient Currently Attending School?: No Last Grade Completed: 9 Did Garment/textile technologist From [employer]?: No Did Theme park manager?: No Did Designer, television/film set?: No   CCA Family/Childhood History Family and Relationship History: Family history Does patient have children?: No  Childhood History:  Childhood History Additional childhood history information: UTA Description of patient's relationship with caregiver when they were a child: UTA Patient's description of current relationship with people who raised him/her: UTA How were you disciplined when you got in trouble as a child/adolescent?: UTA  Child/Adolescent Assessment:     CCA Substance Use Alcohol/Drug Use: Alcohol / Drug Use Pain Medications: See MAR Prescriptions: See MAR Over the Counter: See MAR History of alcohol / drug use?: Yes Substance #1 Name of Substance 1: Marijuana 1 - Age of First Use: 10 1 - Amount (size/oz): 2 grams 1 - Frequency: daily 1 - Duration: ongoing 1 - Last Use / Amount: today  ASAM's:  Six Dimensions of Multidimensional Assessment  Dimension 1:  Acute Intoxication and/or Withdrawal Potential:      Dimension 2:  Biomedical Conditions and Complications:      Dimension 3:  Emotional, Behavioral, or Cognitive Conditions and Complications:     Dimension 4:  Readiness to Change:     Dimension 5:  Relapse, Continued use, or Continued Problem Potential:     Dimension 6:  Recovery/Living Environment:     ASAM Severity Score:    ASAM Recommended Level of Treatment:     Substance use Disorder (SUD)    Recommendations for Services/Supports/Treatments: Recommendations for Services/Supports/Treatments Recommendations For Services/Supports/Treatments: Medication Management, Individual Therapy  DSM5 Diagnoses: Patient Active Problem List   Diagnosis Date Noted  . Adjustment disorder with mixed anxiety and depressed mood 03/30/2020  . History of  schizoaffective disorder 03/30/2020    Disposition: Per Shuvon Rankin, NP, patient is recommended for discharge after collateral information obtained from family and safety planning completed.   Mathew Cooke Shirlee More, [facility]

## 2020-03-30 NOTE — ED Notes (Signed)
Pt ambulated per self to retrieve belongings. No new issues. Escorted to front lobby. Stable at time of d/c

## 2020-03-31 ENCOUNTER — Encounter (HOSPITAL_COMMUNITY): Payer: Self-pay | Admitting: Psychiatry

## 2020-03-31 ENCOUNTER — Encounter (HOSPITAL_COMMUNITY): Payer: Self-pay

## 2020-03-31 ENCOUNTER — Other Ambulatory Visit (HOSPITAL_COMMUNITY): Payer: Self-pay | Admitting: Psychiatry

## 2020-03-31 ENCOUNTER — Ambulatory Visit (INDEPENDENT_AMBULATORY_CARE_PROVIDER_SITE_OTHER): Payer: No Payment, Other | Admitting: Psychiatry

## 2020-03-31 DIAGNOSIS — F411 Generalized anxiety disorder: Secondary | ICD-10-CM

## 2020-03-31 DIAGNOSIS — F330 Major depressive disorder, recurrent, mild: Secondary | ICD-10-CM | POA: Insufficient documentation

## 2020-03-31 MED ORDER — FLUOXETINE HCL 10 MG PO CAPS: 10.0000 mg | ORAL_CAPSULE | Freq: Every day | ORAL | 2 refills | Status: DC

## 2020-03-31 MED ORDER — TRAZODONE HCL 50 MG PO TABS: 50.0000 mg | ORAL_TABLET | Freq: Every day | ORAL | 2 refills | Status: DC

## 2020-03-31 MED ORDER — HYDROXYZINE HCL 10 MG PO TABS: 10.0000 mg | ORAL_TABLET | Freq: Three times a day (TID) | ORAL | 2 refills | Status: DC | PRN

## 2020-03-31 NOTE — Progress Notes (Signed)
Psychiatric Initial Adult Assessment   Patient Identification: Mathew Cooke MRN:  [id number] Date of Evaluation:  03/31/2020 Referral Source: [facility] Chief Complaint:  Things are better than they were yesterday Visit Diagnosis:    ICD-10-CM   1. Mild episode of recurrent major depressive disorder (HCC)  F33.0 FLUoxetine (PROZAC) 10 MG capsule    traZODone (DESYREL) 50 MG tablet    Ambulatory referral to Social Work  2. Generalized anxiety disorder  F41.1 hydrOXYzine (ATARAX/VISTARIL) 10 MG tablet    FLUoxetine (PROZAC) 10 MG capsule    Ambulatory referral to Social Work    History of Present Illness: 24 year old male seen today for initial psychiatric evaluation.  He was referred to outpatient psychiatry by [facility] where he was seen on 03/30/2020 presenting with worsening depression and suicidal ideations.  He has a psychiatric history of adjustment disorder, schizoaffective disorder, anxiety, ADHD, depression, and SI.  He is currently being managed on trazodone 50 mg nightly, hydroxyzine 25 mg 3 times daily, and Zyprexa 5 mg daily.  He noted that he has not taken his medications in over a year (since being released from jail).    Today he is well-groomed, pleasant, cooperative, engaged in conversation, and maintained eye contact.  He describes his mood as "better".  Provider conducted a GAD-7 and patient scored a 13.  Provider also conducted a PHQ-9 and patient scored a 8.  Patient informed provider that recently he was suicidal after breaking up with his girlfriend.  He noted that he feels that at times he is not good enough for her.  He informed Clinical research associate that she gets upset and leaves when he does not buy her clothing or alcohol.  He endorsed having depressed mood, anhedonia, insomnia (noting that he sleeps 1 hour a night), hopelessness, anxiety, reduced energy, and decreased appetite.  At times he noted that he is distractible, irritable, and spends excessively on his girlfriend.  He also  informed provider that he has racing thoughts about his future and finances.  He informed provider that he would like to find a job so that he can be financially stable.  Provider recommended patient talk to care management team today.  He noted that he did not have time today but reported that he will call the team for resources in the future.  He noted that his mother recently took his credit card because of his spending habits.  He denies SI/HI/VAH however endorses paranoia noting that he feels that people are out to get him.  Patient informed provider that he spent time in jail for processing a gun and stealing a car.  He informed provider that he smokes marijuana at least 6 times a week drinks alcohol socially.  Provider informed patient that his substance use can exacerbate irritability.  He endorsed understanding.  Patient noted that he has not been on any medications in over a year.  He is agreeable to restarting trazodone 50 mg nightly to help manage sleep.  He is also agreeable to restarting hydroxyzine 10 mg 3 times daily as needed for anxiety.  He will also start Prozac 10 mg daily to help manage symptoms of anxiety and depression.  At this time Zyprexa will not be restarted. Potential side effects of medication and risks vs benefits of treatment vs non-treatment were explained and discussed. All questions were answered.  Patient will follow up with outpatient counseling for therapy.  No other concerns noted at this time.  Associated Signs/Symptoms: Depression Symptoms:  depressed mood, anhedonia, insomnia, psychomotor  agitation, fatigue, feelings of worthlessness/guilt, hopelessness, anxiety, panic attacks, loss of energy/fatigue, disturbed sleep, weight loss, decreased appetite, (Hypo) Manic Symptoms:  Distractibility, Financial Extravagance, Impulsivity, Irritable Mood, Anxiety Symptoms:  Excessive Worry, Panic Symptoms, Psychotic Symptoms:  Paranoia, PTSD  Symptoms: NA  Past Psychiatric History: Adjustment disorder, schizoaffective disorder, anxiety, depression, SI.  Previous Psychotropic Medications: Have tried ritlin, zyprexa, trazodone, and hydroxyzine  Substance Abuse History in the last 12 months:  Yes.    Consequences of Substance Abuse: NA  Past Medical History: History reviewed. No pertinent past medical history. History reviewed. No pertinent surgical history.  Family Psychiatric History: Mother Schizoaffective disorder   Family History: History reviewed. No pertinent family history.  Social History:   Social History   Socioeconomic History  . Marital status: Single    Spouse name: Not on file  . Number of children: Not on file  . Years of education: Not on file  . Highest education level: Not on file  Occupational History  . Not on file  Tobacco Use  . Smoking status: Not on file  Substance and Sexual Activity  . Alcohol use: Not on file  . Drug use: Not on file  . Sexual activity: Not on file  Other Topics Concern  . Not on file  Social History Narrative  . Not on file   Social Determinants of Health   Financial Resource Strain:   . Difficulty of Paying Living Expenses: Not on file  Food Insecurity:   . Worried About Programme researcher, broadcasting/film/video in the Last Year: Not on file  . Ran Out of Food in the Last Year: Not on file  Transportation Needs:   . Lack of Transportation (Medical): Not on file  . Lack of Transportation (Non-Medical): Not on file  Physical Activity:   . Days of Exercise per Week: Not on file  . Minutes of Exercise per Session: Not on file  Stress:   . Feeling of Stress : Not on file  Social Connections:   . Frequency of Communication with Friends and Family: Not on file  . Frequency of Social Gatherings with Friends and Family: Not on file  . Attends Religious Services: Not on file  . Active Member of Clubs or Organizations: Not on file  . Attends Banker Meetings: Not on file   . Marital Status: Not on file    Additional Social History: Patient resides in [place] with his cousin.  He is currently single and has no children.  He notes that he is unemployed.  He endorses smoking a pack of cigarettes a day and notes that he is not ready to quit.  He endorses smoking marijuana 6 times a week.  He endorses drinking alcohol occasionally  Allergies:  Not on File  Metabolic Disorder Labs: No results found for: HGBA1C, MPG No results found for: PROLACTIN No results found for: CHOL, TRIG, HDL, CHOLHDL, VLDL, LDLCALC No results found for: TSH  Therapeutic Level Labs: No results found for: LITHIUM No results found for: CBMZ No results found for: VALPROATE  Current Medications: Current Outpatient Medications  Medication Sig Dispense Refill  . OLANZapine (ZYPREXA) 10 MG tablet Take 5 mg by mouth at bedtime.    . traZODone (DESYREL) 50 MG tablet Take 1 tablet (50 mg total) by mouth at bedtime. 30 tablet 2  . FLUoxetine (PROZAC) 10 MG capsule Take 1 capsule (10 mg total) by mouth daily. 30 capsule 2  . hydrOXYzine (ATARAX/VISTARIL) 10 MG tablet Take 1 tablet (  10 mg total) by mouth 3 (three) times daily as needed. 90 tablet 2   No current facility-administered medications for this visit.    Musculoskeletal: Strength & Muscle Tone: within normal limits Gait & Station: normal Patient leans: N/A  Psychiatric Specialty Exam: Review of Systems  There were no vitals taken for this visit.There is no height or weight on file to calculate BMI.  General Appearance: Well Groomed  Eye Contact:  Good  Speech:  Clear and Coherent and Normal Rate  Volume:  Normal  Mood:  Anxious and Depressed  Affect:  Appropriate and Non-Congruent  Thought Process:  Coherent, Goal Directed and NA  Orientation:  Full (Time, Place, and Person)  Thought Content:  WDL and Logical  Suicidal Thoughts:  No  Homicidal Thoughts:  No  Memory:  Immediate;   Good Recent;   Good Remote;   Good   Judgement:  Good  Insight:  Good  Psychomotor Activity:  Normal  Concentration:  Concentration: Good and Attention Span: Good  Recall:  Good  Fund of Knowledge:Good  Language: Good  Akathisia:  No  Handed:  Right  AIMS (if indicated):  Not done  Assets:  Communication Skills Desire for Improvement Financial Resources/Insurance Housing  ADL's:  Intact  Cognition: WNL  Sleep:  Poor   Screenings: GAD-7     Clinical Support [facility] 03/31/2020 in [facility]  Total GAD-7 Score 13    PHQ2-9     Clinical Support from 03/31/2020 in [facility]  PHQ-2 Total Score 1  PHQ-9 Total Score 8      Assessment and Plan: Patient endorses symptoms of anxiety, depression, and poor sleep.  He is agreeable to restarting hydroxyzine 10 mg 3 times daily to help manage symptoms of anxiety.  He is also agreeable to starting Prozac 10 mg to help manage symptoms of depression anxiety.  He will also restart trazodone 50 mg nightly as needed for sleep.  1. Mild episode of recurrent major depressive disorder (HCC)  Start- FLUoxetine (PROZAC) 10 MG capsule; Take 1 capsule (10 mg total) by mouth daily.  Dispense: 30 capsule; Refill: 2 Restart- traZODone (DESYREL) 50 MG tablet; Take 1 tablet (50 mg total) by mouth at bedtime.  Dispense: 30 tablet; Refill: 2 - Ambulatory referral to Social Work  2. Generalized anxiety disorder  Restart- hydrOXYzine (ATARAX/VISTARIL) 10 MG tablet; Take 1 tablet (10 mg total) by mouth 3 (three) times daily as needed.  Dispense: 90 tablet; Refill: 2 Start- FLUoxetine (PROZAC) 10 MG capsule; Take 1 capsule (10 mg total) by mouth daily.  Dispense: 30 capsule; Refill: 2 - Ambulatory referral to Social Work  Follow-up in 3 months Follow-up with therapy Shanna Cisco, NP 11/30/202110:06 AM

## 2020-04-06 ENCOUNTER — Telehealth (HOSPITAL_COMMUNITY): Payer: Self-pay | Admitting: General Practice

## 2020-04-06 NOTE — Telephone Encounter (Signed)
Care Management - Follow Up [facility] Discharges   Writer attempted to make contact with patient today and was unsuccessful.  Writer was able to leave a HIPPA compliant voice message and will await callback.  Per chart review patient completed an appointment on 03-31-2020 with Gretchen Short, NP and a follow up appointment on 06-25-2020 with [facility], LCSW.

## 2020-05-25 ENCOUNTER — Ambulatory Visit (HOSPITAL_COMMUNITY): Payer: Medicare Other | Admitting: Clinical

## 2020-05-25 ENCOUNTER — Other Ambulatory Visit: Payer: Self-pay

## 2020-06-29 ENCOUNTER — Telehealth (HOSPITAL_COMMUNITY): Payer: Medicare Other | Admitting: Psychiatry

## 2020-06-29 ENCOUNTER — Other Ambulatory Visit: Payer: Self-pay

## 2020-09-10 ENCOUNTER — Other Ambulatory Visit: Payer: Self-pay

## 2020-09-10 MED FILL — Hydroxyzine HCl Tab 10 MG: ORAL | 30 days supply | Qty: 90 | Fill #0 | Status: AC

## 2020-09-10 MED FILL — Fluoxetine HCl Cap 10 MG: ORAL | 30 days supply | Qty: 30 | Fill #0 | Status: AC

## 2020-09-10 MED FILL — Trazodone HCl Tab 50 MG: ORAL | 30 days supply | Qty: 30 | Fill #0 | Status: AC

## 2020-09-11 ENCOUNTER — Other Ambulatory Visit: Payer: Self-pay

## 2021-04-25 ENCOUNTER — Emergency Department (HOSPITAL_COMMUNITY): Payer: Medicare Other

## 2021-04-25 ENCOUNTER — Inpatient Hospital Stay (HOSPITAL_COMMUNITY)
Admission: EM | Admit: 2021-04-25 | Discharge: 2021-06-02 | DRG: 870 | Disposition: E | Payer: Medicare Other | Attending: Internal Medicine | Admitting: Internal Medicine

## 2021-04-25 ENCOUNTER — Inpatient Hospital Stay (HOSPITAL_COMMUNITY): Payer: Medicare Other

## 2021-04-25 ENCOUNTER — Other Ambulatory Visit: Payer: Self-pay

## 2021-04-25 DIAGNOSIS — J850 Gangrene and necrosis of lung: Secondary | ICD-10-CM | POA: Diagnosis present

## 2021-04-25 DIAGNOSIS — F259 Schizoaffective disorder, unspecified: Secondary | ICD-10-CM | POA: Diagnosis not present

## 2021-04-25 DIAGNOSIS — R0602 Shortness of breath: Secondary | ICD-10-CM

## 2021-04-25 DIAGNOSIS — Z01818 Encounter for other preprocedural examination: Secondary | ICD-10-CM

## 2021-04-25 DIAGNOSIS — Z9689 Presence of other specified functional implants: Secondary | ICD-10-CM

## 2021-04-25 DIAGNOSIS — J9601 Acute respiratory failure with hypoxia: Secondary | ICD-10-CM | POA: Diagnosis not present

## 2021-04-25 DIAGNOSIS — A419 Sepsis, unspecified organism: Secondary | ICD-10-CM | POA: Diagnosis present

## 2021-04-25 DIAGNOSIS — R6521 Severe sepsis with septic shock: Secondary | ICD-10-CM | POA: Diagnosis present

## 2021-04-25 DIAGNOSIS — R609 Edema, unspecified: Secondary | ICD-10-CM | POA: Diagnosis not present

## 2021-04-25 DIAGNOSIS — J189 Pneumonia, unspecified organism: Secondary | ICD-10-CM | POA: Diagnosis not present

## 2021-04-25 DIAGNOSIS — Z20822 Contact with and (suspected) exposure to covid-19: Secondary | ICD-10-CM | POA: Diagnosis present

## 2021-04-25 DIAGNOSIS — Z9289 Personal history of other medical treatment: Secondary | ICD-10-CM

## 2021-04-25 DIAGNOSIS — F32A Depression, unspecified: Secondary | ICD-10-CM | POA: Diagnosis present

## 2021-04-25 DIAGNOSIS — Z515 Encounter for palliative care: Secondary | ICD-10-CM | POA: Diagnosis not present

## 2021-04-25 DIAGNOSIS — R739 Hyperglycemia, unspecified: Secondary | ICD-10-CM | POA: Diagnosis not present

## 2021-04-25 DIAGNOSIS — Z7189 Other specified counseling: Secondary | ICD-10-CM | POA: Diagnosis not present

## 2021-04-25 DIAGNOSIS — Z66 Do not resuscitate: Secondary | ICD-10-CM | POA: Diagnosis not present

## 2021-04-25 DIAGNOSIS — J930 Spontaneous tension pneumothorax: Secondary | ICD-10-CM | POA: Diagnosis not present

## 2021-04-25 DIAGNOSIS — U071 COVID-19: Secondary | ICD-10-CM

## 2021-04-25 DIAGNOSIS — J80 Acute respiratory distress syndrome: Secondary | ICD-10-CM | POA: Diagnosis present

## 2021-04-25 DIAGNOSIS — I428 Other cardiomyopathies: Secondary | ICD-10-CM | POA: Diagnosis present

## 2021-04-25 DIAGNOSIS — F1210 Cannabis abuse, uncomplicated: Secondary | ICD-10-CM | POA: Diagnosis present

## 2021-04-25 DIAGNOSIS — D62 Acute posthemorrhagic anemia: Secondary | ICD-10-CM | POA: Diagnosis not present

## 2021-04-25 DIAGNOSIS — I5021 Acute systolic (congestive) heart failure: Secondary | ICD-10-CM | POA: Diagnosis not present

## 2021-04-25 DIAGNOSIS — R1110 Vomiting, unspecified: Secondary | ICD-10-CM

## 2021-04-25 DIAGNOSIS — Z9911 Dependence on respirator [ventilator] status: Secondary | ICD-10-CM | POA: Diagnosis not present

## 2021-04-25 DIAGNOSIS — Z9151 Personal history of suicidal behavior: Secondary | ICD-10-CM

## 2021-04-25 DIAGNOSIS — Y92239 Unspecified place in hospital as the place of occurrence of the external cause: Secondary | ICD-10-CM | POA: Diagnosis not present

## 2021-04-25 DIAGNOSIS — R000 Tachycardia, unspecified: Secondary | ICD-10-CM | POA: Diagnosis not present

## 2021-04-25 DIAGNOSIS — J984 Other disorders of lung: Secondary | ICD-10-CM | POA: Diagnosis not present

## 2021-04-25 DIAGNOSIS — F17210 Nicotine dependence, cigarettes, uncomplicated: Secondary | ICD-10-CM | POA: Diagnosis present

## 2021-04-25 DIAGNOSIS — J690 Pneumonitis due to inhalation of food and vomit: Secondary | ICD-10-CM | POA: Diagnosis present

## 2021-04-25 DIAGNOSIS — I110 Hypertensive heart disease with heart failure: Secondary | ICD-10-CM | POA: Diagnosis present

## 2021-04-25 DIAGNOSIS — K7200 Acute and subacute hepatic failure without coma: Secondary | ICD-10-CM | POA: Diagnosis present

## 2021-04-25 DIAGNOSIS — Z978 Presence of other specified devices: Secondary | ICD-10-CM | POA: Diagnosis not present

## 2021-04-25 DIAGNOSIS — R579 Shock, unspecified: Secondary | ICD-10-CM | POA: Diagnosis not present

## 2021-04-25 DIAGNOSIS — N179 Acute kidney failure, unspecified: Secondary | ICD-10-CM | POA: Diagnosis present

## 2021-04-25 DIAGNOSIS — R0489 Hemorrhage from other sites in respiratory passages: Secondary | ICD-10-CM | POA: Diagnosis not present

## 2021-04-25 DIAGNOSIS — E874 Mixed disorder of acid-base balance: Secondary | ICD-10-CM | POA: Diagnosis present

## 2021-04-25 DIAGNOSIS — E870 Hyperosmolality and hypernatremia: Secondary | ICD-10-CM | POA: Diagnosis not present

## 2021-04-25 DIAGNOSIS — R0902 Hypoxemia: Secondary | ICD-10-CM

## 2021-04-25 DIAGNOSIS — I10 Essential (primary) hypertension: Secondary | ICD-10-CM | POA: Diagnosis not present

## 2021-04-25 DIAGNOSIS — Z452 Encounter for adjustment and management of vascular access device: Secondary | ICD-10-CM

## 2021-04-25 DIAGNOSIS — R6520 Severe sepsis without septic shock: Secondary | ICD-10-CM | POA: Diagnosis not present

## 2021-04-25 DIAGNOSIS — J942 Hemothorax: Secondary | ICD-10-CM | POA: Diagnosis not present

## 2021-04-25 DIAGNOSIS — M7989 Other specified soft tissue disorders: Secondary | ICD-10-CM | POA: Diagnosis not present

## 2021-04-25 DIAGNOSIS — J869 Pyothorax without fistula: Secondary | ICD-10-CM | POA: Diagnosis present

## 2021-04-25 DIAGNOSIS — E876 Hypokalemia: Secondary | ICD-10-CM | POA: Diagnosis not present

## 2021-04-25 DIAGNOSIS — Z4659 Encounter for fitting and adjustment of other gastrointestinal appliance and device: Secondary | ICD-10-CM

## 2021-04-25 DIAGNOSIS — Z79899 Other long term (current) drug therapy: Secondary | ICD-10-CM

## 2021-04-25 DIAGNOSIS — D6489 Other specified anemias: Secondary | ICD-10-CM | POA: Diagnosis present

## 2021-04-25 DIAGNOSIS — T380X5A Adverse effect of glucocorticoids and synthetic analogues, initial encounter: Secondary | ICD-10-CM | POA: Diagnosis not present

## 2021-04-25 DIAGNOSIS — J939 Pneumothorax, unspecified: Secondary | ICD-10-CM

## 2021-04-25 LAB — POCT I-STAT 7, (LYTES, BLD GAS, ICA,H+H)
Acid-Base Excess: 1 mmol/L (ref 0.0–2.0)
Acid-base deficit: 3 mmol/L — ABNORMAL HIGH (ref 0.0–2.0)
Acid-base deficit: 3 mmol/L — ABNORMAL HIGH (ref 0.0–2.0)
Bicarbonate: 28.7 mmol/L — ABNORMAL HIGH (ref 20.0–28.0)
Bicarbonate: 23.1 mmol/L (ref 20.0–28.0)
Bicarbonate: 22.6 mmol/L (ref 20.0–28.0)
Calcium, Ion: 1.15 mmol/L (ref 1.15–1.40)
Calcium, Ion: 1.08 mmol/L — ABNORMAL LOW (ref 1.15–1.40)
Calcium, Ion: 1.12 mmol/L — ABNORMAL LOW (ref 1.15–1.40)
HCT: 33 % — ABNORMAL LOW (ref 39.0–52.0)
HCT: 31 % — ABNORMAL LOW (ref 39.0–52.0)
HCT: 28 % — ABNORMAL LOW (ref 39.0–52.0)
Hemoglobin: 11.2 g/dL — ABNORMAL LOW (ref 13.0–17.0)
Hemoglobin: 10.5 g/dL — ABNORMAL LOW (ref 13.0–17.0)
Hemoglobin: 9.5 g/dL — ABNORMAL LOW (ref 13.0–17.0)
O2 Saturation: 97 %
O2 Saturation: 88 %
O2 Saturation: 98 %
Patient temperature: 102.8
Potassium: 4 mmol/L (ref 3.5–5.1)
Potassium: 4.2 mmol/L (ref 3.5–5.1)
Potassium: 3.6 mmol/L (ref 3.5–5.1)
Sodium: 135 mmol/L (ref 135–145)
Sodium: 139 mmol/L (ref 135–145)
Sodium: 137 mmol/L (ref 135–145)
TCO2: 31 mmol/L (ref 22–32)
TCO2: 24 mmol/L (ref 22–32)
TCO2: 24 mmol/L (ref 22–32)
pCO2 arterial: 61.4 mmHg — ABNORMAL HIGH (ref 32.0–48.0)
pCO2 arterial: 49 mmHg — ABNORMAL HIGH (ref 32.0–48.0)
pCO2 arterial: 42.5 mmHg (ref 32.0–48.0)
pH, Arterial: 7.278 — ABNORMAL LOW (ref 7.350–7.450)
pH, Arterial: 7.293 — ABNORMAL LOW (ref 7.350–7.450)
pH, Arterial: 7.334 — ABNORMAL LOW (ref 7.350–7.450)
pO2, Arterial: 108 mmHg (ref 83.0–108.0)
pO2, Arterial: 68 mmHg — ABNORMAL LOW (ref 83.0–108.0)
pO2, Arterial: 109 mmHg — ABNORMAL HIGH (ref 83.0–108.0)

## 2021-04-25 LAB — COMPREHENSIVE METABOLIC PANEL
ALT: 18 U/L (ref 0–44)
AST: 26 U/L (ref 15–41)
Albumin: 2.5 g/dL — ABNORMAL LOW (ref 3.5–5.0)
Alkaline Phosphatase: 56 U/L (ref 38–126)
Anion gap: 10 (ref 5–15)
BUN: 24 mg/dL — ABNORMAL HIGH (ref 6–20)
CO2: 25 mmol/L (ref 22–32)
Calcium: 8.1 mg/dL — ABNORMAL LOW (ref 8.9–10.3)
Chloride: 97 mmol/L — ABNORMAL LOW (ref 98–111)
Creatinine, Ser: 0.97 mg/dL (ref 0.61–1.24)
GFR, Estimated: >60 mL/min (ref >60)
Glucose, Bld: 141 mg/dL — ABNORMAL HIGH (ref 70–99)
Potassium: 3.5 mmol/L (ref 3.5–5.1)
Sodium: 132 mmol/L — ABNORMAL LOW (ref 135–145)
Total Bilirubin: 5.3 mg/dL — ABNORMAL HIGH (ref 0.3–1.2)
Total Protein: 7.1 g/dL (ref 6.5–8.1)

## 2021-04-25 LAB — URINALYSIS, ROUTINE W REFLEX MICROSCOPIC
Glucose, UA: NEGATIVE mg/dL
Hgb urine dipstick: NEGATIVE
Ketones, ur: NEGATIVE mg/dL
Leukocytes,Ua: NEGATIVE
Nitrite: NEGATIVE
Protein, ur: NEGATIVE mg/dL
Specific Gravity, Urine: 1.02 (ref 1.005–1.030)
pH: 5.5 (ref 5.0–8.0)

## 2021-04-25 LAB — I-STAT CHEM 8, ED
BUN: 25 mg/dL — ABNORMAL HIGH (ref 6–20)
Calcium, Ion: 1.03 mmol/L — ABNORMAL LOW (ref 1.15–1.40)
Chloride: 98 mmol/L (ref 98–111)
Creatinine, Ser: 0.8 mg/dL (ref 0.61–1.24)
Glucose, Bld: 140 mg/dL — ABNORMAL HIGH (ref 70–99)
HCT: 39 % (ref 39.0–52.0)
Hemoglobin: 13.3 g/dL (ref 13.0–17.0)
Potassium: 3.5 mmol/L (ref 3.5–5.1)
Sodium: 136 mmol/L (ref 135–145)
TCO2: 27 mmol/L (ref 22–32)

## 2021-04-25 LAB — CBC WITH DIFFERENTIAL/PLATELET
Abs Immature Granulocytes: 0.27 10*3/uL — ABNORMAL HIGH (ref 0.00–0.07)
Basophils Absolute: 0 10*3/uL (ref 0.0–0.1)
Basophils Relative: 0 %
Eosinophils Absolute: 0 10*3/uL (ref 0.0–0.5)
Eosinophils Relative: 0 %
HCT: 35.7 % — ABNORMAL LOW (ref 39.0–52.0)
Hemoglobin: 12.7 g/dL — ABNORMAL LOW (ref 13.0–17.0)
Immature Granulocytes: 2 %
Lymphocytes Relative: 4 %
Lymphs Abs: 0.7 10*3/uL (ref 0.7–4.0)
MCH: 31 pg (ref 26.0–34.0)
MCHC: 35.6 g/dL (ref 30.0–36.0)
MCV: 87.1 fL (ref 80.0–100.0)
Monocytes Absolute: 1.8 10*3/uL — ABNORMAL HIGH (ref 0.1–1.0)
Monocytes Relative: 10 %
Neutro Abs: 15.8 10*3/uL — ABNORMAL HIGH (ref 1.7–7.7)
Neutrophils Relative %: 84 %
Platelets: 342 10*3/uL (ref 150–400)
RBC: 4.1 MIL/uL — ABNORMAL LOW (ref 4.22–5.81)
RDW: 11.9 % (ref 11.5–15.5)
WBC: 18.6 10*3/uL — ABNORMAL HIGH (ref 4.0–10.5)
nRBC: 0 % (ref 0.0–0.2)

## 2021-04-25 LAB — I-STAT ARTERIAL BLOOD GAS, ED
Acid-Base Excess: 4 mmol/L — ABNORMAL HIGH (ref 0.0–2.0)
Bicarbonate: 29.1 mmol/L — ABNORMAL HIGH (ref 20.0–28.0)
Calcium, Ion: 1.13 mmol/L — ABNORMAL LOW (ref 1.15–1.40)
HCT: 37 % — ABNORMAL LOW (ref 39.0–52.0)
Hemoglobin: 12.6 g/dL — ABNORMAL LOW (ref 13.0–17.0)
O2 Saturation: 99 %
Potassium: 3.9 mmol/L (ref 3.5–5.1)
Sodium: 135 mmol/L (ref 135–145)
TCO2: 31 mmol/L (ref 22–32)
pCO2 arterial: 46.4 mmHg (ref 32.0–48.0)
pH, Arterial: 7.406 (ref 7.350–7.450)
pO2, Arterial: 130 mmHg — ABNORMAL HIGH (ref 83.0–108.0)

## 2021-04-25 LAB — GLUCOSE, CAPILLARY
Glucose-Capillary: 133 mg/dL — ABNORMAL HIGH (ref 70–99)
Glucose-Capillary: 143 mg/dL — ABNORMAL HIGH (ref 70–99)

## 2021-04-25 LAB — TROPONIN I (HIGH SENSITIVITY): Troponin I (High Sensitivity): 3 ng/L (ref <18)

## 2021-04-25 LAB — TYPE AND SCREEN
ABO/RH(D): O POS
Antibody Screen: NEGATIVE

## 2021-04-25 LAB — COOXEMETRY PANEL
Carboxyhemoglobin: 0.9 % (ref 0.5–1.5)
Methemoglobin: 0.9 % (ref 0.0–1.5)
O2 Saturation: 63.4 %
Total hemoglobin: 10 g/dL — ABNORMAL LOW (ref 12.0–16.0)

## 2021-04-25 LAB — PROTIME-INR
INR: 1.2 (ref 0.8–1.2)
Prothrombin Time: 15.3 seconds — ABNORMAL HIGH (ref 11.4–15.2)

## 2021-04-25 LAB — RAPID URINE DRUG SCREEN, HOSP PERFORMED
Amphetamines: NOT DETECTED
Barbiturates: NOT DETECTED
Benzodiazepines: NOT DETECTED
Cocaine: NOT DETECTED
Opiates: NOT DETECTED
Tetrahydrocannabinol: POSITIVE — AB

## 2021-04-25 LAB — HIV ANTIBODY (ROUTINE TESTING W REFLEX): HIV Screen 4th Generation wRfx: NONREACTIVE

## 2021-04-25 LAB — MRSA NEXT GEN BY PCR, NASAL: MRSA by PCR Next Gen: NOT DETECTED

## 2021-04-25 LAB — MAGNESIUM: Magnesium: 1.8 mg/dL (ref 1.7–2.4)

## 2021-04-25 LAB — RESP PANEL BY RT-PCR (FLU A&B, COVID) ARPGX2
Influenza A by PCR: NEGATIVE
Influenza B by PCR: NEGATIVE
SARS Coronavirus 2 by RT PCR: NEGATIVE

## 2021-04-25 LAB — ETHANOL: Alcohol, Ethyl (B): <10 mg/dL (ref <10)

## 2021-04-25 LAB — BILIRUBIN, FRACTIONATED(TOT/DIR/INDIR)
Bilirubin, Direct: 3.1 mg/dL — ABNORMAL HIGH (ref 0.0–0.2)
Indirect Bilirubin: 2 mg/dL — ABNORMAL HIGH (ref 0.3–0.9)
Total Bilirubin: 5.1 mg/dL — ABNORMAL HIGH (ref 0.3–1.2)

## 2021-04-25 LAB — LACTIC ACID, PLASMA
Lactic Acid, Venous: 1.6 mmol/L (ref 0.5–1.9)
Lactic Acid, Venous: 4 mmol/L (ref 0.5–1.9)
Lactic Acid, Venous: 4.4 mmol/L (ref 0.5–1.9)

## 2021-04-25 LAB — APTT: aPTT: 30 seconds (ref 24–36)

## 2021-04-25 LAB — ABO/RH: ABO/RH(D): O POS

## 2021-04-25 MED ORDER — LACTATED RINGERS IV SOLN: INTRAVENOUS | Status: DC

## 2021-04-25 MED ORDER — QUETIAPINE FUMARATE 50 MG PO TABS
50.0000 mg | ORAL_TABLET | Freq: Two times a day (BID) | ORAL | Status: DC
  Administered 2021-04-25 – 2021-04-30 (×11): 50 mg
  Filled 2021-04-25 (×11): qty 1

## 2021-04-25 MED ORDER — FENTANYL 2500MCG IN NS 250ML (10MCG/ML) PREMIX INFUSION
50.0000 ug/h | INTRAVENOUS | Status: DC
  Administered 2021-04-25: 09:00:00 50 ug/h via INTRAVENOUS
  Filled 2021-04-25: qty 250

## 2021-04-25 MED ORDER — METRONIDAZOLE 500 MG/100ML IV SOLN
500.0000 mg | Freq: Two times a day (BID) | INTRAVENOUS | Status: DC
Start: 2021-04-25 — End: 2021-04-28
  Administered 2021-04-25 – 2021-04-28 (×7): 500 mg via INTRAVENOUS
  Filled 2021-04-25 (×7): qty 100

## 2021-04-25 MED ORDER — FENTANYL CITRATE PF 50 MCG/ML IJ SOSY
50.0000 ug | PREFILLED_SYRINGE | Freq: Once | INTRAMUSCULAR | Status: AC
  Administered 2021-04-25: 05:00:00 50 ug via INTRAVENOUS
  Filled 2021-04-25: qty 1

## 2021-04-25 MED ORDER — NOREPINEPHRINE 4 MG/250ML-% IV SOLN
0.0000 ug/min | INTRAVENOUS | Status: DC
  Administered 2021-04-25: 19:00:00 28 ug/min via INTRAVENOUS
  Filled 2021-04-25: qty 250

## 2021-04-25 MED ORDER — VANCOMYCIN HCL 1250 MG/250ML IV SOLN
1250.0000 mg | Freq: Two times a day (BID) | INTRAVENOUS | Status: DC
Start: 2021-04-25 — End: 2021-04-25
  Filled 2021-04-25: qty 250

## 2021-04-25 MED ORDER — FENTANYL CITRATE PF 50 MCG/ML IJ SOSY
PREFILLED_SYRINGE | INTRAMUSCULAR | Status: AC
  Administered 2021-04-25: 09:00:00 100 ug via INTRAVENOUS
  Filled 2021-04-25: qty 2

## 2021-04-25 MED ORDER — HYDROCORTISONE SOD SUC (PF) 100 MG IJ SOLR
75.0000 mg | Freq: Two times a day (BID) | INTRAMUSCULAR | Status: DC
  Administered 2021-04-25 – 2021-04-27 (×4): 75 mg via INTRAVENOUS
  Filled 2021-04-25 (×4): qty 2

## 2021-04-25 MED ORDER — VANCOMYCIN HCL 1500 MG/300ML IV SOLN
1500.0000 mg | Freq: Once | INTRAVENOUS | Status: DC
  Filled 2021-04-25: qty 300

## 2021-04-25 MED ORDER — POLYETHYLENE GLYCOL 3350 17 G PO PACK
17.0000 g | PACK | Freq: Every day | ORAL | Status: DC
  Administered 2021-04-26 – 2021-05-09 (×8): 17 g
  Filled 2021-04-25 (×10): qty 1

## 2021-04-25 MED ORDER — PHENYLEPHRINE 40 MCG/ML (10ML) SYRINGE FOR IV PUSH (FOR BLOOD PRESSURE SUPPORT)
PREFILLED_SYRINGE | INTRAVENOUS | Status: AC
  Filled 2021-04-25: qty 10

## 2021-04-25 MED ORDER — LACTATED RINGERS IV BOLUS (SEPSIS)
1000.0000 mL | Freq: Once | INTRAVENOUS | Status: AC
  Administered 2021-04-25: 05:00:00 1000 mL via INTRAVENOUS

## 2021-04-25 MED ORDER — MIDAZOLAM HCL 2 MG/2ML IJ SOLN: 4.0000 mg | Freq: Once | INTRAMUSCULAR | Status: AC

## 2021-04-25 MED ORDER — MIDAZOLAM BOLUS VIA INFUSION
1.0000 mg | INTRAVENOUS | Status: DC | PRN
  Administered 2021-04-25 – 2021-05-07 (×18): 2 mg via INTRAVENOUS
  Filled 2021-04-25: qty 2

## 2021-04-25 MED ORDER — VECURONIUM BROMIDE 10 MG IV SOLR
INTRAVENOUS | Status: AC
  Administered 2021-04-25: 17:00:00 10 mg
  Filled 2021-04-25: qty 10

## 2021-04-25 MED ORDER — ACETAMINOPHEN 325 MG PO TABS: 650.0000 mg | ORAL_TABLET | ORAL | Status: DC

## 2021-04-25 MED ORDER — MIDAZOLAM-SODIUM CHLORIDE 100-0.9 MG/100ML-% IV SOLN
2.0000 mg/h | INTRAVENOUS | Status: DC
  Administered 2021-04-25: 17:00:00 2 mg/h via INTRAVENOUS
  Administered 2021-04-26: 14:00:00 5 mg/h via INTRAVENOUS
  Administered 2021-04-26: 02:00:00 10 mg/h via INTRAVENOUS
  Administered 2021-04-27: 10:00:00 5 mg/h via INTRAVENOUS
  Administered 2021-04-28: 23:00:00 7 mg/h via INTRAVENOUS
  Administered 2021-04-28: 04:00:00 5 mg/h via INTRAVENOUS
  Administered 2021-04-29: 11:00:00 8 mg/h via INTRAVENOUS
  Administered 2021-04-29 – 2021-05-05 (×14): 10 mg/h via INTRAVENOUS
  Administered 2021-05-05: 8 mg/h via INTRAVENOUS
  Administered 2021-05-06 (×3): 20 mg/h via INTRAVENOUS
  Administered 2021-05-06: 10 mg/h via INTRAVENOUS
  Administered 2021-05-07: 20 mg/h via INTRAVENOUS
  Administered 2021-05-07: 30 mg/h via INTRAVENOUS
  Administered 2021-05-07: 20 mg/h via INTRAVENOUS
  Administered 2021-05-07: 30 mg/h via INTRAVENOUS
  Administered 2021-05-07: 20 mg/h via INTRAVENOUS
  Administered 2021-05-07: 37 mg/h via INTRAVENOUS
  Filled 2021-04-25 (×32): qty 100

## 2021-04-25 MED ORDER — VECURONIUM BROMIDE 10 MG IV SOLR: 0.1000 mg/kg | INTRAVENOUS | Status: DC | PRN

## 2021-04-25 MED ORDER — LACTATED RINGERS IV BOLUS
1000.0000 mL | Freq: Once | INTRAVENOUS | Status: AC
  Administered 2021-04-25: 18:00:00 1000 mL via INTRAVENOUS

## 2021-04-25 MED ORDER — PHENYLEPHRINE HCL-NACL 20-0.9 MG/250ML-% IV SOLN
0.0000 ug/min | INTRAVENOUS | Status: DC
  Administered 2021-04-25: 17:00:00 400 ug/min via INTRAVENOUS
  Administered 2021-04-25: 16:00:00 20 ug/min via INTRAVENOUS
  Filled 2021-04-25 (×3): qty 250

## 2021-04-25 MED ORDER — PROPOFOL 1000 MG/100ML IV EMUL
0.0000 ug/kg/min | INTRAVENOUS | Status: DC
  Administered 2021-04-25: 14:00:00 30 ug/kg/min via INTRAVENOUS
  Administered 2021-04-25: 10:00:00 20 ug/kg/min via INTRAVENOUS
  Filled 2021-04-25: qty 100

## 2021-04-25 MED ORDER — ACETAMINOPHEN 160 MG/5ML PO SOLN
650.0000 mg | ORAL | Status: DC
  Administered 2021-04-25 – 2021-04-28 (×17): 650 mg
  Filled 2021-04-25 (×17): qty 20.3

## 2021-04-25 MED ORDER — MIDAZOLAM HCL 2 MG/2ML IJ SOLN
INTRAMUSCULAR | Status: AC
  Filled 2021-04-25: qty 4

## 2021-04-25 MED ORDER — FLUOXETINE HCL 10 MG PO CAPS
10.0000 mg | ORAL_CAPSULE | Freq: Every day | ORAL | Status: DC
  Filled 2021-04-25: qty 1

## 2021-04-25 MED ORDER — ETOMIDATE 2 MG/ML IV SOLN
INTRAVENOUS | Status: AC
  Administered 2021-04-25: 09:00:00 20 mg via INTRAVENOUS
  Filled 2021-04-25: qty 20

## 2021-04-25 MED ORDER — FENTANYL 2500MCG IN NS 250ML (10MCG/ML) PREMIX INFUSION
50.0000 ug/h | INTRAVENOUS | Status: AC
  Administered 2021-04-25 – 2021-04-26 (×3): 250 ug/h via INTRAVENOUS
  Administered 2021-04-26 (×2): 300 ug/h via INTRAVENOUS
  Administered 2021-04-27: 19:00:00 275 ug/h via INTRAVENOUS
  Administered 2021-04-27: 09:00:00 250 ug/h via INTRAVENOUS
  Administered 2021-04-28: 23:00:00 300 ug/h via INTRAVENOUS
  Administered 2021-04-28: 14:00:00 250 ug/h via INTRAVENOUS
  Administered 2021-04-28: 04:00:00 275 ug/h via INTRAVENOUS
  Administered 2021-04-29 – 2021-05-01 (×6): 300 ug/h via INTRAVENOUS
  Administered 2021-05-01: 250 ug/h via INTRAVENOUS
  Administered 2021-05-01: 300 ug/h via INTRAVENOUS
  Filled 2021-04-25 (×17): qty 250

## 2021-04-25 MED ORDER — FENTANYL CITRATE PF 50 MCG/ML IJ SOSY: 50.0000 ug | PREFILLED_SYRINGE | INTRAMUSCULAR | Status: DC | PRN

## 2021-04-25 MED ORDER — ROCURONIUM BROMIDE 10 MG/ML (PF) SYRINGE
PREFILLED_SYRINGE | INTRAVENOUS | Status: AC
  Administered 2021-04-25: 09:00:00 80 mg via INTRAVENOUS
  Filled 2021-04-25: qty 10

## 2021-04-25 MED ORDER — FENTANYL CITRATE PF 50 MCG/ML IJ SOSY: 100.0000 ug | PREFILLED_SYRINGE | Freq: Once | INTRAMUSCULAR | Status: AC

## 2021-04-25 MED ORDER — ALBUMIN HUMAN 25 % IV SOLN
12.5000 g | Freq: Once | INTRAVENOUS | Status: AC
  Administered 2021-04-25: 23:00:00 12.5 g via INTRAVENOUS
  Filled 2021-04-25: qty 50

## 2021-04-25 MED ORDER — NOREPINEPHRINE 16 MG/250ML-% IV SOLN
0.0000 ug/min | INTRAVENOUS | Status: DC
  Administered 2021-04-25: 21:00:00 40 ug/min via INTRAVENOUS
  Administered 2021-04-26: 14:00:00 22 ug/min via INTRAVENOUS
  Administered 2021-04-26: 03:00:00 36 ug/min via INTRAVENOUS
  Administered 2021-04-27: 02:00:00 20 ug/min via INTRAVENOUS
  Administered 2021-04-28: 02:00:00 4 ug/min via INTRAVENOUS
  Filled 2021-04-25 (×5): qty 250

## 2021-04-25 MED ORDER — DOCUSATE SODIUM 50 MG/5ML PO LIQD
100.0000 mg | Freq: Two times a day (BID) | ORAL | Status: DC
  Administered 2021-04-25 – 2021-05-09 (×17): 100 mg
  Filled 2021-04-25 (×19): qty 10

## 2021-04-25 MED ORDER — VECURONIUM BROMIDE 10 MG IV SOLR
0.0000 ug/kg/min | Status: DC
  Administered 2021-04-25: 20:00:00 1 ug/kg/min via INTRAVENOUS
  Filled 2021-04-25: qty 100

## 2021-04-25 MED ORDER — ENOXAPARIN SODIUM 40 MG/0.4ML IJ SOSY
40.0000 mg | PREFILLED_SYRINGE | INTRAMUSCULAR | Status: DC
  Administered 2021-04-25 – 2021-04-28 (×4): 40 mg via SUBCUTANEOUS
  Filled 2021-04-25 (×4): qty 0.4

## 2021-04-25 MED ORDER — SODIUM CHLORIDE 0.9 % IV SOLN
250.0000 mL | INTRAVENOUS | Status: DC
  Administered 2021-04-25 – 2021-05-05 (×11): 250 mL via INTRAVENOUS

## 2021-04-25 MED ORDER — LACTATED RINGERS IV BOLUS
1000.0000 mL | Freq: Once | INTRAVENOUS | Status: AC
  Administered 2021-04-25: 10:00:00 1000 mL via INTRAVENOUS

## 2021-04-25 MED ORDER — FENTANYL CITRATE (PF) 100 MCG/2ML IJ SOLN
50.0000 ug | Freq: Once | INTRAMUSCULAR | Status: AC
  Administered 2021-04-25: 18:00:00 50 ug via INTRAVENOUS

## 2021-04-25 MED ORDER — POLYETHYLENE GLYCOL 3350 17 G PO PACK
17.0000 g | PACK | Freq: Every day | ORAL | Status: DC | PRN
  Administered 2021-04-27: 21:00:00 17 g via ORAL
  Filled 2021-04-25 (×2): qty 1

## 2021-04-25 MED ORDER — MAGNESIUM SULFATE 4 GM/100ML IV SOLN
4.0000 g | Freq: Once | INTRAVENOUS | Status: AC
  Administered 2021-04-25: 21:00:00 4 g via INTRAVENOUS
  Filled 2021-04-25: qty 100

## 2021-04-25 MED ORDER — DOCUSATE SODIUM 50 MG/5ML PO LIQD: 100.0000 mg | Freq: Two times a day (BID) | ORAL | Status: DC

## 2021-04-25 MED ORDER — ARTIFICIAL TEARS OPHTHALMIC OINT: 1.0000 "application " | TOPICAL_OINTMENT | Freq: Three times a day (TID) | OPHTHALMIC | Status: DC

## 2021-04-25 MED ORDER — ROCURONIUM BROMIDE 50 MG/5ML IV SOLN: 80.0000 mg | Freq: Once | INTRAVENOUS | Status: AC

## 2021-04-25 MED ORDER — LACTATED RINGERS IV BOLUS
1000.0000 mL | Freq: Once | INTRAVENOUS | Status: AC
  Administered 2021-04-25: 14:00:00 1000 mL via INTRAVENOUS

## 2021-04-25 MED ORDER — DEXMEDETOMIDINE HCL IN NACL 400 MCG/100ML IV SOLN: 0.4000 ug/kg/h | INTRAVENOUS | Status: DC

## 2021-04-25 MED ORDER — ORAL CARE MOUTH RINSE
15.0000 mL | OROMUCOSAL | Status: DC
  Administered 2021-04-26 – 2021-05-09 (×135): 15 mL via OROMUCOSAL

## 2021-04-25 MED ORDER — FENTANYL BOLUS VIA INFUSION
50.0000 ug | INTRAVENOUS | Status: DC | PRN
  Filled 2021-04-25: qty 100

## 2021-04-25 MED ORDER — PROPOFOL 1000 MG/100ML IV EMUL
0.0000 ug/kg/min | INTRAVENOUS | Status: DC
  Administered 2021-04-25: 09:00:00 5 ug/kg/min via INTRAVENOUS

## 2021-04-25 MED ORDER — FLUOXETINE HCL 10 MG PO CAPS
10.0000 mg | ORAL_CAPSULE | Freq: Every day | ORAL | Status: DC
  Administered 2021-04-26 – 2021-05-01 (×6): 10 mg
  Filled 2021-04-25 (×7): qty 1

## 2021-04-25 MED ORDER — ARTIFICIAL TEARS OPHTHALMIC OINT
1.0000 "application " | TOPICAL_OINTMENT | Freq: Three times a day (TID) | OPHTHALMIC | Status: DC
  Administered 2021-04-25 – 2021-04-27 (×6): 1 via OPHTHALMIC
  Filled 2021-04-25 (×2): qty 3.5

## 2021-04-25 MED ORDER — CHLORHEXIDINE GLUCONATE 0.12% ORAL RINSE (MEDLINE KIT)
15.0000 mL | Freq: Two times a day (BID) | OROMUCOSAL | Status: DC
  Administered 2021-04-25 – 2021-05-09 (×28): 15 mL via OROMUCOSAL

## 2021-04-25 MED ORDER — MIDAZOLAM-SODIUM CHLORIDE 100-0.9 MG/100ML-% IV SOLN
0.5000 mg/h | INTRAVENOUS | Status: DC
Start: 2021-04-25 — End: 2021-04-25

## 2021-04-25 MED ORDER — CEFTRIAXONE SODIUM 2 G IJ SOLR
2.0000 g | INTRAMUSCULAR | Status: DC
  Administered 2021-04-26 – 2021-04-28 (×3): 2 g via INTRAVENOUS
  Filled 2021-04-25 (×3): qty 20

## 2021-04-25 MED ORDER — POLYETHYLENE GLYCOL 3350 17 G PO PACK: 17.0000 g | PACK | Freq: Every day | ORAL | Status: DC

## 2021-04-25 MED ORDER — SODIUM CHLORIDE 0.9 % IV SOLN
1.0000 g | Freq: Once | INTRAVENOUS | Status: AC
  Administered 2021-04-25: 05:00:00 1 g via INTRAVENOUS
  Filled 2021-04-25: qty 10

## 2021-04-25 MED ORDER — VANCOMYCIN HCL 1250 MG/250ML IV SOLN
1250.0000 mg | INTRAVENOUS | Status: AC
  Administered 2021-04-25: 07:00:00 1250 mg via INTRAVENOUS
  Filled 2021-04-25: qty 250

## 2021-04-25 MED ORDER — VASOPRESSIN 20 UNITS/100 ML INFUSION FOR SHOCK
0.0000 [IU]/min | INTRAVENOUS | Status: DC
  Administered 2021-04-25 – 2021-04-27 (×6): 0.03 [IU]/min via INTRAVENOUS
  Filled 2021-04-25 (×6): qty 100

## 2021-04-25 MED ORDER — VANCOMYCIN HCL 750 MG/150ML IV SOLN
750.0000 mg | Freq: Three times a day (TID) | INTRAVENOUS | Status: DC
Start: 2021-04-25 — End: 2021-04-28
  Administered 2021-04-25 – 2021-04-28 (×9): 750 mg via INTRAVENOUS
  Filled 2021-04-25 (×10): qty 150

## 2021-04-25 MED ORDER — IOHEXOL 350 MG/ML SOLN
100.0000 mL | Freq: Once | INTRAVENOUS | Status: AC
  Administered 2021-04-25: 07:00:00 75 mL via INTRAVENOUS

## 2021-04-25 MED ORDER — DEXMEDETOMIDINE HCL IN NACL 400 MCG/100ML IV SOLN
0.4000 ug/kg/h | INTRAVENOUS | Status: DC
  Administered 2021-04-25: 13:00:00 0.4 ug/kg/h via INTRAVENOUS
  Filled 2021-04-25: qty 100

## 2021-04-25 MED ORDER — KETAMINE HCL 50 MG/5ML IJ SOSY
PREFILLED_SYRINGE | INTRAMUSCULAR | Status: AC
  Filled 2021-04-25: qty 5

## 2021-04-25 MED ORDER — CHLORHEXIDINE GLUCONATE CLOTH 2 % EX PADS
6.0000 | MEDICATED_PAD | Freq: Every day | CUTANEOUS | Status: DC
  Administered 2021-04-25 – 2021-05-09 (×14): 6 via TOPICAL

## 2021-04-25 MED ORDER — SODIUM CHLORIDE 0.9 % IV SOLN: 1.0000 g | Freq: Every day | INTRAVENOUS | Status: DC

## 2021-04-25 MED ORDER — DOCUSATE SODIUM 100 MG PO CAPS: 100.0000 mg | ORAL_CAPSULE | Freq: Two times a day (BID) | ORAL | Status: DC | PRN

## 2021-04-25 MED ORDER — FENTANYL BOLUS VIA INFUSION
50.0000 ug | INTRAVENOUS | Status: AC | PRN
  Administered 2021-04-26 – 2021-04-29 (×9): 50 ug via INTRAVENOUS
  Filled 2021-04-25: qty 50

## 2021-04-25 MED ORDER — PHENYLEPHRINE HCL-NACL 20-0.9 MG/250ML-% IV SOLN
25.0000 ug/min | INTRAVENOUS | Status: DC
Start: 2021-04-25 — End: 2021-04-25

## 2021-04-25 MED ORDER — NOREPINEPHRINE 4 MG/250ML-% IV SOLN
INTRAVENOUS | Status: AC
  Administered 2021-04-25: 16:00:00 10 ug/min via INTRAVENOUS
  Filled 2021-04-25: qty 250

## 2021-04-25 MED ORDER — ACETAMINOPHEN 650 MG RE SUPP: 650.0000 mg | RECTAL | Status: DC

## 2021-04-25 MED ORDER — STERILE WATER FOR INJECTION IJ SOLN
50.0000 ng/kg/min | INTRAVENOUS | Status: DC
  Administered 2021-04-25 – 2021-04-26 (×5): 50 ng/kg/min via RESPIRATORY_TRACT
  Administered 2021-04-27: 11:00:00 30 ng/kg/min via RESPIRATORY_TRACT
  Administered 2021-04-27: 13:00:00 20 ng/kg/min via RESPIRATORY_TRACT
  Administered 2021-04-27: 01:00:00 50 ng/kg/min via RESPIRATORY_TRACT
  Administered 2021-04-27: 09:00:00 40 ng/kg/min via RESPIRATORY_TRACT
  Administered 2021-04-27: 15:00:00 10 ng/kg/min via RESPIRATORY_TRACT
  Administered 2021-04-27: 08:00:00 50 ng/kg/min via RESPIRATORY_TRACT
  Filled 2021-04-25 (×9): qty 5

## 2021-04-25 MED ORDER — SODIUM CHLORIDE 0.9 % IV SOLN
500.0000 mg | Freq: Once | INTRAVENOUS | Status: AC
  Administered 2021-04-25: 07:00:00 500 mg via INTRAVENOUS
  Filled 2021-04-25: qty 5

## 2021-04-25 MED ORDER — PROPOFOL 1000 MG/100ML IV EMUL
INTRAVENOUS | Status: AC
  Administered 2021-04-25: 09:00:00 10 ug/kg/min via INTRAVENOUS
  Filled 2021-04-25: qty 100

## 2021-04-25 MED ORDER — FENTANYL CITRATE PF 50 MCG/ML IJ SOSY: 50.0000 ug | PREFILLED_SYRINGE | Freq: Once | INTRAMUSCULAR | Status: DC

## 2021-04-25 MED ORDER — VECURONIUM BOLUS VIA INFUSION
5.0000 mg | Freq: Once | INTRAVENOUS | Status: DC
  Filled 2021-04-25: qty 5

## 2021-04-25 MED ORDER — ETOMIDATE 2 MG/ML IV SOLN: 20.0000 mg | Freq: Once | INTRAVENOUS | Status: AC

## 2021-04-25 MED ORDER — SODIUM CHLORIDE 0.9 % IV SOLN: INTRAVENOUS | Status: DC | PRN

## 2021-04-25 MED ORDER — MIDAZOLAM HCL 2 MG/2ML IJ SOLN
INTRAMUSCULAR | Status: AC
  Filled 2021-04-25: qty 2

## 2021-04-25 NOTE — Procedures (Signed)
Bronchoscopy Procedure Note  Mathew Cooke  [id number]  02/06/1996  Date:04/07/2021  Time:4:04 PM   Provider Performing:Israa Caban   Procedure(s):  Flexible Bronchoscopy [phone]) and Initial Therapeutic Aspiration of Tracheobronchial Tree [phone])  Indication(s) Acute respiratory failure with hypoxia  Consent Risks of the procedure as well as the alternatives and risks of each were explained to the patient and/or caregiver.  Consent for the procedure was obtained and is signed in the bedside chart  Anesthesia Versed and propofol   Time Out Verified patient identification, verified procedure, site/side was marked, verified correct patient position, special equipment/implants available, medications/allergies/relevant history reviewed, required imaging and test results available.   Sterile Technique Usual hand hygiene, masks, gowns, and gloves were used   Procedure Description Bronchoscope advanced through ETTand into airway.  Airways were examined down to subsegmental level with findings noted below.   Following diagnostic evaluation, Therapeutic aspiration performed in RLL/LLL  Findings: Copious amount of thin yellowish secretions noted throughout the bronchial tree suctioned and sent to lab for cx   Complications/Tolerance None; patient tolerated the procedure well. Chest X-ray is needed post procedure.   EBL Minimal   Specimen(s) Sent to lab

## 2021-04-25 NOTE — ED Notes (Signed)
RT at bedside to place pt on bipap

## 2021-04-25 NOTE — ED Notes (Addendum)
Upon rounding on pt, pt not responding to verbal commands. Pt noted to be alert with eyes open, but just looking around room. Pt remains on BiPAP, tolerating OK. Pt remains tachypneic. RT called to check on pt. HR noted to be 120s. Asked PA to come check on pt.

## 2021-04-25 NOTE — Sepsis Progress Note (Signed)
Monitoring for code sepsis protocol. 

## 2021-04-25 NOTE — ED Notes (Signed)
Pt changed into brief at this time

## 2021-04-25 NOTE — Progress Notes (Deleted)
Pharmacy Antibiotic Note  Mathew Cooke is a 25 y.o. male admitted on 05-05-2021 with pneumonia.  Pharmacy has been consulted for Vancomycin dosing.  Plan: Vanco 1500mg  LD x1 followed by vanco 1250mg  q12hr  eAUC 488 Plan to obtain levels at steady state Will monitor for acute changes in renal function and adjust as needed F/u cultures results and de-escalate as appropriate  Height: 5\' 7"  (170.2 cm) Weight: 63.5 kg (139 lb 15.9 oz) IBW/kg (Calculated) : 66.1  Temp (24hrs), Avg:97.4 F (36.3 C), Min:97.2 F (36.2 C), Max:97.5 F (36.4 C)  Recent Labs  Lab May 05, 2021 0426 05/05/21 0456  WBC 18.6*  --   CREATININE 0.97 0.80  LATICACIDVEN 1.6  --     Estimated Creatinine Clearance: 124.6 mL/min (by C-G formula based on SCr of 0.8 mg/dL).    No Known Allergies  Antimicrobials this admission: Vanco 12/25 >>  Ceftriaxone 12/25 >>  Metronid 12/25>>  Dose adjustments this admission: none  Microbiology results: 12/25 BCx: IP 12/25 Sputum: IP  12/25 MRSA PCR: IP  Thank you for allowing pharmacy to be a part of this patients care.  1/26, PharmD Clinical Pharmacist  Please check AMION for all [facility] Pharmacy numbers After 10:00 PM, call Main Pharmacy [phone]

## 2021-04-25 NOTE — Progress Notes (Signed)
eLink Physician-Brief Progress Note Patient Name: Mathew Cooke DOB: [birth year] MRN: [id number]   Date of Service  May 04, 2021  HPI/Events of Note  Notified of lactic acid 4 from 1.6 likely from hypoxemia  BP 97/69 on Levo and vaso now off neo O2 94% intubated 24/530/100%/14 PEEP Sedated on Fenta 300 and Versed 5 about to be started on vecuronium  eICU Interventions  Will continue to trend lactic acid Neo to be re-started of BP continues to be low ECMO team aware of this patient Discussed with bedside RN     Intervention Category Major Interventions: Other:  [name] 05/04/21, 8:12 PM

## 2021-04-25 NOTE — Procedures (Signed)
Arterial Catheter Insertion Procedure Note  Mathew Cooke  [id number]  01/22/1996  Date:04/16/2021  Time:5:45 PM    Provider Performing: Omar Person    Procedure: Insertion of Arterial Line (65784) with US guidance (69629)       Indication(s) Blood pressure monitoring and/or need for frequent ABGs  Consent Risks of the procedure as well as the alternatives and risks of each were explained to the patient and/or caregiver.  Consent for the procedure was obtained and is signed in the bedside chart  Anesthesia None   Time Out Verified patient identification, verified procedure, site/side was marked, verified correct patient position, special equipment/implants available, medications/allergies/relevant history reviewed, required imaging and test results available.   Sterile Technique Maximal sterile technique including full sterile barrier drape, hand hygiene, sterile gown, sterile gloves, mask, hair covering, sterile ultrasound probe cover (if used).   Procedure Description Area of catheter insertion was cleaned with chlorhexidine and draped in sterile fashion. With real-time ultrasound guidance an arterial catheter was placed into the right femoral artery.  Appropriate arterial tracings confirmed on monitor.     Complications/Tolerance None; patient tolerated the procedure well.   EBL Minimal   Specimen(s) None

## 2021-04-25 NOTE — Progress Notes (Signed)
Ventilator patient transported from ED29 to 3M10 without any issues.

## 2021-04-25 NOTE — ED Triage Notes (Signed)
Pt BIB EMS from home - pt called out for SOB - per EMS pt has been sick for around 2 wks with N/V/D + SOB. PT symptoms have gotten worse the last 2-3 days and not able to eat or drink  Initially pt HR at 170 70% RA - lungs diminished  EMS started on 6L 94%  18LAC  of fluids  HR at 114 BP 148/90 CBG 128

## 2021-04-25 NOTE — ED Notes (Signed)
Intubated 8.0 ett 26 @ lip. Xray called for stat post intubation CXR.

## 2021-04-25 NOTE — H&P (Addendum)
NAME:  Mathew Cooke, MRN:  [id number], DOB:  July 20, 1995, LOS: 0 ADMISSION DATE:  04/13/2021, CONSULTATION DATE:  04/27/2021 REFERRING MD:  Alison Murray, CHIEF COMPLAINT:  dyspnea   History of Present Illness:  25yM with history of schizoaffective disorder, prior suicide attempt who was BIBEMS from home for dyspnea which had worsened over 2-3d PTA. History obtained thru chart review has he is in extremis from respiratory standpoint at time of my evaluation. Nausea, several episodes of emesis today. Left sided pleuritic CP. EMS gave him a liter of fluid.  In ED he was found to have necrotizing pneumonia and was given 1L LR, vanc, ceftriaxone and placed on nasal cannula which was escalated to BiPAP.   Pertinent  Medical History  Schizoaffective disorder, prior suicide attempt  Significant Hospital Events: Including procedures, antibiotic start and stop dates in addition to other pertinent events   12/25 admitted, ETT placed   Interim History / Subjective:    Objective   Blood pressure (!) 176/118, pulse (!) 154, temperature (!) 97.2 F (36.2 C), temperature source Axillary, resp. rate (!) 42, height 5\' 7"  (1.702 m), SpO2 93 %.    FiO2 (%):  [60 %] 60 %   Intake/Output Summary (Last 24 hours) at 04/10/2021 0910 Last data filed at 04/11/2021 0820 Gross per 24 hour  Intake 350 ml  Output --  Net 350 ml   There were no vitals filed for this visit.  Examination: General appearance: 25 y.o., male, tripoding on BiPAP Eyes: anicteric sclerae; PERRL, tracking appropriately HENT: NCAT; MMM Neck: Trachea midline; no lymphadenopathy, no JVD Lungs: Rhonchorous and diminished on left, ++increased respiratory effort on BiPAP  CV: tachy RR, no murmur  Abdomen: Soft, non-tender; non-distended, BS present  Extremities: No peripheral edema, warm Skin: Normal turgor and texture; no rash Neuro: Alert and oriented to person and place, follows commands   Resolved Hospital Problem list      Assessment & Plan:  # Acute hypoxemic respiratory failure # Necrotizing pneumonia # Left basilar pneumothorax/pyopneumothorax - full vent support - lung protective ventilation, try to keep peep <10, plat <30 in setting basilar PTX and necrotizing pna - trach aspirate, AFB - vanc, ceftriaxone, flagyl follow cultures and narrow as able - RASS -2 with fentanyl, propofol  # Hyperbilirubinemia # Nausea, vomiting - 34 abdomen - trend LFTs  # Schizoaffective disorder Not taking olanzapine, trazodone. - prozac 10 daily  Best Practice (right click and "Reselect all SmartList Selections" daily)   Diet/type: tubefeeds DVT prophylaxis: LMWH GI prophylaxis: N/A Lines: N/A Foley:  N/A Code Status:  full code Last date of multidisciplinary goals of care discussion [Will update mother later today]  Labs   CBC: Recent Labs  Lab 04/04/2021 0426 04/16/2021 0456 04/08/2021 0739  WBC 18.6*  --   --   NEUTROABS 15.8*  --   --   HGB 12.7* 13.3 12.6*  HCT 35.7* 39.0 37.0*  MCV 87.1  --   --   PLT 342  --   --     Basic Metabolic Panel: Recent Labs  Lab 04/26/2021 0426 04/19/2021 0456 04/17/2021 0739  NA 132* 136 135  K 3.5 3.5 3.9  CL 97* 98  --   CO2 25  --   --   GLUCOSE 141* 140*  --   BUN 24* 25*  --   CREATININE 0.97 0.80  --   CALCIUM 8.1*  --   --    GFR: CrCl cannot be calculated (Unknown ideal weight.). Recent  Labs  Lab 04/22/2021 0426  WBC 18.6*  LATICACIDVEN 1.6    Liver Function Tests: Recent Labs  Lab 04/04/2021 0426  AST 26  ALT 18  ALKPHOS 56  BILITOT 5.1*   5.3*  PROT 7.1  ALBUMIN 2.5*   No results for input(s): LIPASE, AMYLASE in the last 168 hours. No results for input(s): AMMONIA in the last 168 hours.  ABG    Component Value Date/Time   PHART 7.406 04/19/2021 0739   PCO2ART 46.4 04/01/2021 0739   PO2ART 130 (H) 04/24/2021 0739   HCO3 29.1 (H) 04/18/2021 0739   TCO2 31 04/01/2021 0739   O2SAT 99.0 04/27/2021 0739     Coagulation  Profile: Recent Labs  Lab 04/08/2021 0426  INR 1.2    Cardiac Enzymes: No results for input(s): CKTOTAL, CKMB, CKMBINDEX, TROPONINI in the last 168 hours.  HbA1C: No results found for: HGBA1C  CBG: No results for input(s): GLUCAP in the last 168 hours.  Review of Systems:   Unable to obtain in setting of his increased work of breathing  Past Medical History:  He,  has no past medical history on file.   Surgical History:  No past surgical history on file.   Social History:   reports that he has been smoking cigarettes. He has been smoking an average of 1 pack per day. He has never used smokeless tobacco. He reports that he does not currently use alcohol. He reports current drug use. Drugs: Marijuana and Cocaine.   Family History:  His family history is not on file.   Allergies No Known Allergies   Home Medications  Prior to Admission medications   Medication Sig Start Date End Date Taking? Authorizing Provider  FLUoxetine (PROZAC) 10 MG capsule TAKE 1 CAPSULE (10 MG TOTAL) BY MOUTH DAILY. Patient taking differently: Take 10 mg by mouth daily. 03/31/20 04/11/2021 Yes Toy Cookey E, NP  OLANZapine (ZYPREXA) 10 MG tablet Take 5 mg by mouth at bedtime. Patient not taking: Reported on 04/09/2021    [provider]  traZODone (DESYREL) 50 MG tablet TAKE 1 TABLET (50 MG TOTAL) BY MOUTH AT BEDTIME. Patient not taking: Reported on 04/06/2021 03/31/20 03/31/21  Shanna Cisco, NP     Critical care time: 38 minutes

## 2021-04-25 NOTE — ED Notes (Signed)
Per PA do not need second lactic  ?

## 2021-04-25 NOTE — Procedures (Signed)
Central Venous Catheter Insertion Procedure Note  Mathew Cooke  [id number]  [birth year]  Date:04/07/2021  Time:4:03 PM   Provider Performing:Tian Mcmurtrey   Procedure: Insertion of Non-tunneled Central Venous [phone]) with US guidance ([id number])   Indication(s) Medication administration  Consent Risks of the procedure as well as the alternatives and risks of each were explained to the patient and/or caregiver.  Consent for the procedure was obtained and is signed in the bedside chart  Anesthesia Topical only with 1% lidocaine   Timeout Verified patient identification, verified procedure, site/side was marked, verified correct patient position, special equipment/implants available, medications/allergies/relevant history reviewed, required imaging and test results available.  Sterile Technique Maximal sterile technique including full sterile barrier drape, hand hygiene, sterile gown, sterile gloves, mask, hair covering, sterile ultrasound probe cover (if used).  Procedure Description Area of catheter insertion was cleaned with chlorhexidine and draped in sterile fashion.  With real-time ultrasound guidance a central venous catheter was placed into the left subclavian vein. Nonpulsatile blood flow and easy flushing noted in all ports.  The catheter was sutured in place and sterile dressing applied.  Complications/Tolerance None; patient tolerated the procedure well. Chest X-ray is ordered to verify placement for internal jugular or subclavian cannulation.   Chest x-ray is not ordered for femoral cannulation.  EBL Minimal   Specimen(s) None

## 2021-04-25 NOTE — Progress Notes (Addendum)
Pharmacy Antibiotic Note  Mathew Cooke is a 25 y.o. male admitted on 04/03/2021 with pneumonia.  Pharmacy has been consulted for Vancomycin dosing.  Plan: Vanco 1250mg  LD (in ED) x1 followed by vanco 750mg  q8hr  eAUC 488 Plan to obtain levels at steady state Will monitor for acute changes in renal function and adjust as needed F/u cultures results and de-escalate as appropriate  Height: 5\' 7"  (170.2 cm) Weight: 63.5 kg (139 lb 15.9 oz) IBW/kg (Calculated) : 66.1  Temp (24hrs), Avg:98.4 F (36.9 C), Min:97.2 F (36.2 C), Max:100.4 F (38 C)  Recent Labs  Lab 04/01/2021 0426 04/26/2021 0456  WBC 18.6*  --   CREATININE 0.97 0.80  LATICACIDVEN 1.6  --      Estimated Creatinine Clearance: 124.6 mL/min (by C-G formula based on SCr of 0.8 mg/dL).    No Known Allergies  Antimicrobials this admission: Vanco 12/25 >>  Ceftriaxone 12/25 >>  Metronid 12/25>>  Dose adjustments this admission: none  Microbiology results: 12/25 BCx: IP 12/25 Sputum: IP  12/25 MRSA PCR: IP  Thank you for allowing pharmacy to be a part of this patients care.  1/26, PharmD Clinical Pharmacist  Please check AMION for all [facility] Pharmacy numbers After 10:00 PM, call Main Pharmacy [phone]

## 2021-04-25 NOTE — Progress Notes (Signed)
Pt placed on NIV through servo I. Pt settings are 18/8 with a back up rate of 16. RT will cont to monitor.

## 2021-04-25 NOTE — Procedures (Signed)
Intubation Procedure Note  Mathew Cooke  [id number]  1996/03/12  Date:04/21/2021  Time:9:10 AM   Provider Performing:Tawny Raspberry D. Harris    Procedure: Intubation (31500)  Indication(s) Respiratory Failure  Consent Risks of the procedure as well as the alternatives and risks of each were explained to the patient and/or caregiver.  Consent for the procedure was obtained and is signed in the bedside chart   Anesthesia Etomidate and Rocuronium   Time Out Verified patient identification, verified procedure, site/side was marked, verified correct patient position, special equipment/implants available, medications/allergies/relevant history reviewed, required imaging and test results available.   Sterile Technique Usual hand hygeine, masks, and gloves were used   Procedure Description Patient positioned in bed supine.  Sedation given as noted above.  Patient was intubated with endotracheal tube using Glidescope.  View was Grade 1 full glottis .  Number of attempts was 1.  Colorimetric CO2 detector was consistent with tracheal placement.   Complications/Tolerance None; patient tolerated the procedure well. Chest X-ray is ordered to verify placement.   EBL Minimal   Specimen(s) None  Mathew Cooke D. Tiburcio Pea, NP-C [facility] Personal contact information can be found on Amion  04/11/2021, 9:10 AM

## 2021-04-25 NOTE — Consult Note (Signed)
ECMO Consult Note   Called to 63M for ECMO Consult at (time)1330 by Rise Paganini,  MD Admitting Diagnosis- lung abscess Primary Issue- refractory hypercarbia. Age:25 y.o. Weight: 63.5kg  Days on Mechanical Ventilation- <1  MAP FiO2 Oxygen Index P/F Ratio  65 1.0 N/A 108    Vasopressors no   MSOF No   RESP score (VV-ECMO) :  3 ( 76% survival)  http://www.respscore.com  SAVE score (VA-ECMO) : N/A http://www.save-score.com  Recent Blood Gas:     Component Value Date/Time   PHART 7.278 (L) 04-May-2021 1115   PCO2ART 61.4 (H) 2021-05-04 1115   PO2ART 108 May 04, 2021 1115   HCO3 28.7 (H) May 04, 2021 1115   TCO2 31 2021/05/04 1115   O2SAT 97.0 2021/05/04 1115    Coags:    Component Value Date/Time   INR 1.2 04-May-2021 0426    CBC    Component Value Date/Time   WBC 7.8 05/04/21 1111   RBC 3.77 (L) May 04, 2021 1111   HGB 11.2 (L) May 04, 2021 1115   HCT 33.0 (L) 2021-05-04 1115   PLT 284 04-May-2021 1111   MCV 88.1 05-04-2021 1111   MCH 31.0 05/04/21 1111   MCHC 35.2 04-May-2021 1111   RDW 12.3 May 04, 2021 1111   LYMPHSABS 0.7 2021/05/04 0426   MONOABS 1.8 (H) 05/04/21 0426   EOSABS 0.0 05-04-2021 0426   BASOSABS 0.0 2021-05-04 0426    BMET    Component Value Date/Time   NA 135 May 04, 2021 1115   K 4.0 May 04, 2021 1115   CL 98 May 04, 2021 0456   CO2 25 05/04/2021 0426   GLUCOSE 140 (H) 05-04-21 0456   BUN 25 (H) 05-04-2021 0456   CREATININE 0.97 2021/05/04 1111   CALCIUM 8.1 (L) 05-04-21 0426   GFRNONAA >60 05/04/21 1111   GFRAA >60 05/18/2019 2221                                                                                                                                                              Candidate meets ECMO Criteria- Yes  Placed on ECMO watch at 1430 (time).    Mathew Cooke is an 25 y.o. male.  HPI: previously healthy with no prior cardiorespiratory disease.  Increasing dyspnea for past week., worse over last 3 days,  associated with left pleuritic pain and nausea. No IV drug use. No alcohol use. History or schizoaffective disorder and depression with prior suicide attempt 2016.   Required intubation soon after arrival for increasing distress.   CT chest has confirmed a large lingular abscess.   Difficulty with gas exchange given extensive involvement of the left lung and heterogeneity of lung involvement.   No past medical history on file.  No past surgical history on file.  No family history on file.  Social History:  reports that he  has been smoking cigarettes. He has been smoking an average of 1 pack per day. He has never used smokeless tobacco. He reports that he does not currently use alcohol. He reports current drug use. Drugs: Marijuana and Cocaine.  Allergies: No Known Allergies  Medications: I have reviewed the patient's current medications.  Results for orders placed or performed during the hospital encounter of May 03, 2021 (from the past 48 hour(s))  Lactic acid, plasma     Status: None   Collection Time: 05-03-21  4:26 AM  Result Value Ref Range   Lactic Acid, Venous 1.6 0.5 - 1.9 mmol/L    Comment: Performed at [facility], [address] [place], [place] [address]  Comprehensive metabolic panel     Status: Abnormal   Collection Time: May 03, 2021  4:26 AM  Result Value Ref Range   Sodium 132 (L) 135 - 145 mmol/L   Potassium 3.5 3.5 - 5.1 mmol/L   Chloride 97 (L) 98 - 111 mmol/L   CO2 25 22 - 32 mmol/L   Glucose, Bld 141 (H) 70 - 99 mg/dL    Comment: Glucose reference range applies only to samples taken after fasting for at least 8 hours.   BUN 24 (H) 6 - 20 mg/dL   Creatinine, Ser 6.04 0.61 - 1.24 mg/dL   Calcium 8.1 (L) 8.9 - 10.3 mg/dL   Total Protein 7.1 6.5 - 8.1 g/dL   Albumin 2.5 (L) 3.5 - 5.0 g/dL   AST 26 15 - 41 U/L   ALT 18 0 - 44 U/L   Alkaline Phosphatase 56 38 - 126 U/L   Total Bilirubin 5.3 (H) 0.3 - 1.2 mg/dL   GFR, Estimated >54 >09 mL/min    Comment:  (NOTE) Calculated using the CKD-EPI Creatinine Equation (2021)    Anion gap 10 5 - 15    Comment: Performed at [facility], [address], [place], [place] [address]  CBC WITH DIFFERENTIAL     Status: Abnormal   Collection Time: May 03, 2021  4:26 AM  Result Value Ref Range   WBC 18.6 (H) 4.0 - 10.5 K/uL   RBC 4.10 (L) 4.22 - 5.81 MIL/uL   Hemoglobin 12.7 (L) 13.0 - 17.0 g/dL   HCT 47.8 (L) 29.5 - 62.1 %   MCV 87.1 80.0 - 100.0 fL   MCH 31.0 26.0 - 34.0 pg   MCHC 35.6 30.0 - 36.0 g/dL   RDW 30.8 65.7 - 84.6 %   Platelets 342 150 - 400 K/uL   nRBC 0.0 0.0 - 0.2 %   Neutrophils Relative % 84 %   Neutro Abs 15.8 (H) 1.7 - 7.7 K/uL   Lymphocytes Relative 4 %   Lymphs Abs 0.7 0.7 - 4.0 K/uL   Monocytes Relative 10 %   Monocytes Absolute 1.8 (H) 0.1 - 1.0 K/uL   Eosinophils Relative 0 %   Eosinophils Absolute 0.0 0.0 - 0.5 K/uL   Basophils Relative 0 %   Basophils Absolute 0.0 0.0 - 0.1 K/uL   Immature Granulocytes 2 %   Abs Immature Granulocytes 0.27 (H) 0.00 - 0.07 K/uL    Comment: Performed at [facility], [address], [place], [place] [address]  Protime-INR     Status: Abnormal   Collection Time: 05-03-2021  4:26 AM  Result Value Ref Range   Prothrombin Time 15.3 (H) 11.4 - 15.2 seconds   INR 1.2 0.8 - 1.2    Comment: (NOTE) INR goal varies based on  device and disease states. Performed at [facility], [address] [place], [place] [address]   APTT     Status: None   Collection Time: 05/01/21  4:26 AM  Result Value Ref Range   aPTT 30 24 - 36 seconds    Comment: Performed at [facility], [address], [place], [place] [address]  Troponin I (High Sensitivity)     Status: None   Collection Time: May 01, 2021  4:26 AM  Result Value Ref Range   Troponin I (High Sensitivity) 3 <18 ng/L    Comment: (NOTE) Elevated high sensitivity troponin I (hsTnI) values and significant  changes across serial measurements may suggest ACS but many other  chronic  and acute conditions are known to elevate hsTnI results.  Refer to the "Links" section for chest pain algorithms and additional  guidance. Performed at [facility], [address] [place], [place] [address]   Bilirubin, fractionated(tot/dir/indir)     Status: Abnormal   Collection Time: May 01, 2021  4:26 AM  Result Value Ref Range   Total Bilirubin 5.1 (H) 0.3 - 1.2 mg/dL   Bilirubin, Direct 3.1 (H) 0.0 - 0.2 mg/dL   Indirect Bilirubin 2.0 (H) 0.3 - 0.9 mg/dL    Comment: Performed at [facility], [address] [place], [place] [address]  Ethanol     Status: None   Collection Time: 05-01-2021  4:28 AM  Result Value Ref Range   Alcohol, Ethyl (B) <10 <10 mg/dL    Comment: (NOTE) Lowest detectable limit for serum alcohol is 10 mg/dL.  For medical purposes only. Performed at [facility], [address] [place], [place] [address]   I-stat chem 8, ED (not at [facility] or [facility])     Status: Abnormal   Collection Time: May 01, 2021  4:56 AM  Result Value Ref Range   Sodium 136 135 - 145 mmol/L   Potassium 3.5 3.5 - 5.1 mmol/L   Chloride 98 98 - 111 mmol/L   BUN 25 (H) 6 - 20 mg/dL   Creatinine, Ser 5.28 0.61 - 1.24 mg/dL   Glucose, Bld 413 (H) 70 - 99 mg/dL    Comment: Glucose reference range applies only to samples taken after fasting for at least 8 hours.   Calcium, Ion 1.03 (L) 1.15 - 1.40 mmol/L   TCO2 27 22 - 32 mmol/L   Hemoglobin 13.3 13.0 - 17.0 g/dL   HCT 24.4 01.0 - 27.2 %  Resp Panel by RT-PCR (Flu A&B, Covid) Nasopharyngeal Swab     Status: None   Collection Time: 2021/05/01  4:57 AM   Specimen: Nasopharyngeal Swab; Nasopharyngeal(NP) swabs in vial transport medium  Result Value Ref Range   SARS Coronavirus 2 by RT PCR NEGATIVE NEGATIVE    Comment: (NOTE) SARS-CoV-2 target nucleic acids are NOT DETECTED.  The SARS-CoV-2 RNA is generally detectable in upper respiratory specimens during the acute phase of infection. The lowest concentration of SARS-CoV-2 viral  copies this assay can detect is 138 copies/mL. A negative result does not preclude SARS-Cov-2 infection and should not be used as the sole basis for treatment or other patient management decisions. A negative result may occur with  improper specimen collection/handling, submission of specimen other than nasopharyngeal swab, presence of viral mutation(s) within the areas targeted by this assay, and inadequate number of viral copies(<138 copies/mL). A negative result must be combined with clinical observations, patient history, and epidemiological information. The expected result is Negative.  Fact Sheet for Patients:  BloggerCourse.com  Fact Sheet for Healthcare Providers:  SeriousBroker.it  This test is no t yet approved or cleared by the [place] FDA and  has been authorized for detection and/or diagnosis of SARS-CoV-2 by FDA under an Emergency Use Authorization (EUA). This EUA will remain  in effect (meaning this test can be used) for the duration of the COVID-19 declaration under Section 564(b)(1) of the Act, 21 U.S.C.section 360bbb-3(b)(1), unless the authorization is terminated  or revoked sooner.       Influenza A by PCR NEGATIVE NEGATIVE   Influenza B by PCR NEGATIVE NEGATIVE    Comment: (NOTE) The Xpert Xpress SARS-CoV-2/FLU/RSV plus assay is intended as an aid in the diagnosis of influenza from Nasopharyngeal swab specimens and should not be used as a sole basis for treatment. Nasal washings and aspirates are unacceptable for Xpert Xpress SARS-CoV-2/FLU/RSV testing.  Fact Sheet for Patients: BloggerCourse.com  Fact Sheet for Healthcare Providers: SeriousBroker.it  This test is not yet approved or cleared by the [place] FDA and has been authorized for detection and/or diagnosis of SARS-CoV-2 by FDA under an Emergency Use Authorization (EUA). This EUA will  remain in effect (meaning this test can be used) for the duration of the COVID-19 declaration under Section 564(b)(1) of the Act, 21 U.S.C. section 360bbb-3(b)(1), unless the authorization is terminated or revoked.  Performed at [facility], [address] [place], [place] [address]   I-Stat arterial blood gas, [facility] ED)     Status: Abnormal   Collection Time: 04/26/2021  7:39 AM  Result Value Ref Range   pH, Arterial 7.406 7.350 - 7.450   pCO2 arterial 46.4 32.0 - 48.0 mmHg   pO2, Arterial 130 (H) 83.0 - 108.0 mmHg   Bicarbonate 29.1 (H) 20.0 - 28.0 mmol/L   TCO2 31 22 - 32 mmol/L   O2 Saturation 99.0 %   Acid-Base Excess 4.0 (H) 0.0 - 2.0 mmol/L   Sodium 135 135 - 145 mmol/L   Potassium 3.9 3.5 - 5.1 mmol/L   Calcium, Ion 1.13 (L) 1.15 - 1.40 mmol/L   HCT 37.0 (L) 39.0 - 52.0 %   Hemoglobin 12.6 (L) 13.0 - 17.0 g/dL   Sample type ARTERIAL   CBC     Status: Abnormal   Collection Time: 04/15/2021 11:11 AM  Result Value Ref Range   WBC 7.8 4.0 - 10.5 K/uL   RBC 3.77 (L) 4.22 - 5.81 MIL/uL   Hemoglobin 11.7 (L) 13.0 - 17.0 g/dL   HCT 19.1 (L) 47.8 - 29.5 %   MCV 88.1 80.0 - 100.0 fL   MCH 31.0 26.0 - 34.0 pg   MCHC 35.2 30.0 - 36.0 g/dL   RDW 62.1 30.8 - 65.7 %   Platelets 284 150 - 400 K/uL   nRBC 0.0 0.0 - 0.2 %    Comment: Performed at [facility], [address] [place], [place] [address]  Creatinine, serum     Status: None   Collection Time: 04/18/2021 11:11 AM  Result Value Ref Range   Creatinine, Ser 0.97 0.61 - 1.24 mg/dL   GFR, Estimated >29 >52 mL/min    Comment: (NOTE) Calculated using the CKD-EPI Creatinine Equation (2021) Performed at [facility], [address] [place], [place] [address]   I-STAT 7, (LYTES, BLD GAS, ICA, H+H)     Status: Abnormal   Collection Time: 04/06/2021 11:15 AM  Result Value Ref Range   pH, Arterial 7.278 (  L) 7.350 - 7.450   pCO2 arterial 61.4 (H) 32.0 - 48.0 mmHg   pO2, Arterial 108 83.0 - 108.0 mmHg    Bicarbonate 28.7 (H) 20.0 - 28.0 mmol/L   TCO2 31 22 - 32 mmol/L   O2 Saturation 97.0 %   Acid-Base Excess 1.0 0.0 - 2.0 mmol/L   Sodium 135 135 - 145 mmol/L   Potassium 4.0 3.5 - 5.1 mmol/L   Calcium, Ion 1.15 1.15 - 1.40 mmol/L   HCT 33.0 (L) 39.0 - 52.0 %   Hemoglobin 11.2 (L) 13.0 - 17.0 g/dL   Sample type ARTERIAL   Glucose, capillary     Status: Abnormal   Collection Time: 05/07/21 11:28 AM  Result Value Ref Range   Glucose-Capillary 133 (H) 70 - 99 mg/dL    Comment: Glucose reference range applies only to samples taken after fasting for at least 8 hours.  Urinalysis, Routine w reflex microscopic     Status: Abnormal   Collection Time: May 07, 2021  1:29 PM  Result Value Ref Range   Color, Urine AMBER (A) YELLOW    Comment: BIOCHEMICALS MAY BE AFFECTED BY COLOR   APPearance CLEAR CLEAR   Specific Gravity, Urine 1.020 1.005 - 1.030   pH 5.5 5.0 - 8.0   Glucose, UA NEGATIVE NEGATIVE mg/dL   Hgb urine dipstick NEGATIVE NEGATIVE   Bilirubin Urine MODERATE (A) NEGATIVE   Ketones, ur NEGATIVE NEGATIVE mg/dL   Protein, ur NEGATIVE NEGATIVE mg/dL   Nitrite NEGATIVE NEGATIVE   Leukocytes,Ua NEGATIVE NEGATIVE    Comment: Microscopic not done on urines with negative protein, blood, leukocytes, nitrite, or glucose < 500 mg/dL. Performed at [facility], [address], [place], [place] [address]     DG Abd 1 View  Result Date: 05/07/2021 CLINICAL DATA:  Enteric tube placement EXAM: ABDOMEN - 1 VIEW COMPARISON:  None. FINDINGS: Tip of enteric tube is seen within the stomach. Extensive alveolar infiltrates are seen in the right lung. There is mixed attenuation infiltrates and pockets of air in the left lower lung fields, possibly pneumonia with cavitations. IMPRESSION: Tip of enteric tube is seen in the lumen of body of stomach. Extensive infiltrates are seen in the visualized lower lung fields suggesting multifocal pneumonia. Electronically Signed   By: Ernie Avena M.D.   On:  May 07, 2021 12:17   CT Angio Chest PE W and/or Wo Contrast  Result Date: May 07, 2021 CLINICAL DATA:  Shortness of breath, chest pain EXAM: CT ANGIOGRAPHY CHEST WITH CONTRAST TECHNIQUE: Multidetector CT imaging of the chest was performed using the standard protocol during bolus administration of intravenous contrast. Multiplanar CT image reconstructions and MIPs were obtained to evaluate the vascular anatomy. CONTRAST:  50mL OMNIPAQUE IOHEXOL 350 MG/ML SOLN COMPARISON:  Chest x-ray earlier today FINDINGS: Cardiovascular: No filling defects in the pulmonary arteries to suggest pulmonary emboli. Heart is normal size. Aorta is normal caliber. Mediastinum/Nodes: No mediastinal, hilar, or axillary adenopathy. Trachea and esophagus are unremarkable. Thyroid unremarkable. Lungs/Pleura: Dense consolidation also noted in the adjacent left lower lobe. Airspace disease also seen throughout the right lung involving all 3 lobes. Findings compatible with multifocal pneumonia. There are gas collections with air-fluid levels in the left pleural space concerning for empyema. Cannot exclude bronchopleural fistula. No effusion on the right. Upper Abdomen: Imaging into the upper abdomen demonstrates no acute findings. Musculoskeletal: Chest wall soft tissues are unremarkable. No acute bony abnormality. Review of the MIP images confirms the above findings. IMPRESSION: Extensive cavitary opacity throughout the lingula  concerning for cavitary/necrotic pneumonia. 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 Airspace disease within the left lower lobe and also throughout the right lung (to a lesser extent) compatible with multifocal pneumonia. Electronically Signed   By: Charlett Nose M.D.   On: 2021/05/20 07:26   CT ABDOMEN PELVIS W CONTRAST  Result Date: 05-20-21 CLINICAL DATA:  Left lower quadrant abdominal pain. Nausea, vomiting EXAM: CT ABDOMEN AND  PELVIS WITH CONTRAST TECHNIQUE: Multidetector CT imaging of the abdomen and pelvis was performed using the standard protocol following bolus administration of intravenous contrast. CONTRAST:  75 mL Omnipaque 350 IV COMPARISON:  Chest x-ray and chest CT today FINDINGS: Lower chest: See chest CT report Hepatobiliary: No focal hepatic abnormality. Gallbladder unremarkable. Pancreas: No focal abnormality or ductal dilatation. Spleen: No focal abnormality.  Normal size. Adrenals/Urinary Tract: No adrenal abnormality. No focal renal abnormality. No stones or hydronephrosis. Urinary bladder is unremarkable. Stomach/Bowel: Stomach, large and small bowel grossly unremarkable. Normal appendix. Vascular/Lymphatic: No evidence of aneurysm or adenopathy. Reproductive: No visible focal abnormality. Other: No free fluid or free air. Musculoskeletal: No acute bony abnormality. IMPRESSION: Extensive airspace disease in the lower lobes. See chest CT report today. No acute findings in the abdomen or pelvis. Electronically Signed   By: Charlett Nose M.D.   On: May 20, 2021 07:28   DG Chest Port 1 View  Result Date: 05/20/21 CLINICAL DATA:  ET tube placement EXAM: PORTABLE CHEST 1 VIEW COMPARISON:  05/20/21 FINDINGS: Endotracheal tube is 3.8 cm above the carina. Extensive cavitary airspace disease in the lingula with adjacent pleural gas in the left lower hemithorax. Markedly worsening airspace disease in the right lower lobe. Heart is normal size. IMPRESSION: Extensive left lower lung airspace disease, cavitary with adjacent pleural gas, stable. Markedly worsening right lower lobe airspace disease. Electronically Signed   By: Charlett Nose M.D.   On: 2021/05/20 09:26   DG Chest Port 1 View  Result Date: 2021/05/20 CLINICAL DATA:  Questionable sepsis.  Shortness of breath. EXAM: PORTABLE CHEST 1 VIEW COMPARISON:  02/12/2017 FINDINGS: Consolidation noted throughout the left mid and lower lung, likely within the lingula  compatible with pneumonia. Small left pleural effusion. Heart is normal size. Right lung is clear. IMPRESSION: Left side pneumonia, likely within the lingula. Small left effusion. Electronically Signed   By: Charlett Nose M.D.   On: 20-May-2021 05:13   US Abdomen Limited RUQ (LIVER/GB)  Result Date: 05-20-21 CLINICAL DATA:  Vomiting. EXAM: ULTRASOUND ABDOMEN LIMITED RIGHT UPPER QUADRANT COMPARISON:  Plain film and CT of earlier today FINDINGS: Gallbladder: Sludge filled gallbladder. No wall thickening or pericholecystic fluid. Patient was intubated so Eulah Pont sign could not be evaluated. Common bile duct: Diameter: Normal, 3 mm. Liver: No focal lesion identified. Within normal limits in parenchymal echogenicity. Portal vein is patent on color Doppler imaging with normal direction of blood flow towards the liver. Other: None. IMPRESSION: Gallbladder sludge without specific evidence of acute cholecystitis. Electronically Signed   By: Jeronimo Greaves M.D.   On: 05/20/21 12:06    Review of Systems Blood pressure (!) 86/54, pulse (!) 124, temperature 98.4 F (36.9 C), temperature source Axillary, resp. rate 19, height 5\' 7"  (1.702 m), weight 63.5 kg, SpO2 (!) 88 %. Physical Exam Constitutional:      Appearance: Normal appearance. He is normal weight.  HENT:     Head: Normocephalic and atraumatic.  Cardiovascular:     Rate and Rhythm: Tachycardia present.  Pulmonary:  Breath sounds: Normal breath sounds.  Abdominal:     General: Abdomen is flat.  Genitourinary:    Penis: Normal.   Skin:    General: Skin is warm.  Neurological:     Comments: Chemical sedation.     Assessment/Plan:  Critically ill due to acute hypoxic and hypercarbic respiratory failure due to pneumonia - suspect anaerobes given smell- with lung abscess formation Lung asymmetry complicates ventilator management and likely accounts for poor gas exchange.  Discussed with Thoracic surgery - no role for intervention at this  time.  Will attempt to place right lung up to prevent further soilage and reassess following full 24-48h on antibiotics.  ECMO on standby if still not able to achieve adequate gas exchange.   CRITICAL CARE Performed by: Lynnell Catalan   Total critical care time: 30 minutes  Critical care time was exclusive of separately billable procedures and treating other patients.  Critical care was necessary to treat or prevent imminent or life-threatening deterioration.  Critical care was time spent personally by me on the following activities: development of treatment plan with patient and/or surrogate as well as nursing, discussions with consultants, evaluation of patient's response to treatment, examination of patient, obtaining history from patient or surrogate, ordering and performing treatments and interventions, ordering and review of laboratory studies, ordering and review of radiographic studies, pulse oximetry, re-evaluation of patient's condition and participation in multidisciplinary rounds.  Lynnell Catalan, MD [facility] ICU Physician [facility] [place] [facility]  Pager: [phone] Mobile: [phone] After hours: [phone].   Time: 2:08 PM

## 2021-04-25 NOTE — ED Notes (Signed)
Mother Adelana [phone]

## 2021-04-25 NOTE — ED Notes (Signed)
Timeout completed at this time

## 2021-04-25 NOTE — Progress Notes (Signed)
eLink Physician-Brief Progress Note Patient Name: Mathew Cooke DOB: 12/01/1995 MRN: [id number]   Date of Service  May 13, 2021  HPI/Events of Note  Lactic acid 4.4 from 4 No further increase in pressor requirement  eICU Interventions  Will give atrial of albumin 25% 12.5 mg (60 cc)     Intervention Category Intermediate Interventions: Other:  [name] 2021/05/13, 10:53 PM

## 2021-04-25 NOTE — Progress Notes (Signed)
Called mother of pt to obtain consent for both a central line and arterial line placement.... Education given and mother verb understanding.... Pixie Casino RN served as witness.... consents placed in shadow chart.

## 2021-04-25 NOTE — ED Provider Notes (Signed)
[facility] EMERGENCY DEPARTMENT Provider Note   CSN: [id number] Arrival date & time: 05/01/2021  0418     History Chief Complaint  Patient presents with   Nausea   Emesis   Diarrhea    POSS SEPSIS     LEVEL 5 CAVEAT DUE TO ACUITY OF CONDITION  Mathew Cooke is a 25 y.o. male.  25 year old male with history of schizoaffective disorder, prior suicide attempt presents to the emergency department via EMS.  EMS was called from the home for complaints of shortness of breath.  Patient reports that this has been more profound over the past 2 to 3 days, though he has not been feeling well for at least a week.  Reports nausea, vomiting, diarrhea.  Had 3 episodes of emesis today.  Notes decreased oral intake due to GI symptoms.  Does have some pain in his left side.  While this is pleuritic, he also feels that his discomfort is localized more in his left lower abdomen.  He denies any recent travel, recent antibiotic use, excessive Tylenol use.  Denies ever using IV drugs.  No known sick contacts in the home.  Denies associated fever, hematemesis, melena, hematochezia.  EMS reports initial sats in the 70's on room air. Placed on 6L O2 via [place] with sats improving to 94%. HR in the 170's which improved with 1L IVF given PTA.   The history is provided by the patient and the EMS personnel. No language interpreter was used.  Emesis Associated symptoms: diarrhea   Diarrhea Associated symptoms: vomiting       No past medical history on file.  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     No family history on file.  Social History   Tobacco Use   Smoking status: Every Day     Packs/day: 1.00    Types: Cigarettes   Smokeless tobacco: Never  Substance Use Topics   Alcohol use: Not Currently    Comment: Ocassionally   Drug use: Yes    Types: Marijuana, Cocaine    Comment: heroin    Home Medications Prior to Admission medications   Medication Sig Start Date End Date Taking? Authorizing Provider  FLUoxetine (PROZAC) 10 MG capsule TAKE 1 CAPSULE (10 MG TOTAL) BY MOUTH DAILY. Patient taking differently: Take 10 mg by mouth daily. 03/31/20 04/28/2021 Yes Eulis Canner E, NP  OLANZapine (ZYPREXA) 10 MG tablet Take 5 mg by mouth at bedtime. Patient not taking: Reported on 04/30/2021    [provider]  traZODone (DESYREL) 50 MG tablet TAKE 1 TABLET (50 MG TOTAL) BY MOUTH AT BEDTIME. Patient not taking: Reported on 04/29/2021 03/31/20 03/31/21  Salley Slaughter, NP    Allergies    Patient has no known allergies.  Review of Systems   Review of Systems  Unable to perform ROS: Acuity of condition  Gastrointestinal:  Positive for diarrhea and vomiting.    Physical Exam Updated Vital Signs BP 122/81    Pulse (!) 110    Temp (!) 97.5 F (36.4 C) (Axillary)    Resp (!) 31    Ht 5' 7"  (1.702 m)    SpO2 99%    BMI 21.93 kg/m   Physical  Exam Vitals and nursing note reviewed.  Constitutional:      Appearance: He is ill-appearing and toxic-appearing.  HENT:     Head: Normocephalic and atraumatic.     Right Ear: External ear normal.     Left Ear: External ear normal.  Eyes:     General: Scleral icterus (mild) present.     Extraocular Movements: Extraocular movements intact.     Comments: Pale conjunctiva   Neck:     Comments: No meningismus Cardiovascular:     Rate and Rhythm: Regular rhythm. Tachycardia present.     Pulses: Normal pulses.  Pulmonary:     Effort: Tachypnea present. No accessory muscle usage or respiratory distress.     Comments: Speaking in truncated sentences due to dyspnea. Sats 97% on 6L via NRB. Poor air movement  throughout. No wheezes or rales noted. Abdominal:     Tenderness: There is abdominal tenderness. There is guarding.     Comments: Suspect hepatomegaly on palpation. Some TTP in the RUQ, though mostly TTP in the LLQ with voluntary guarding. Abdomen is nondistended.   Musculoskeletal:     Cervical back: Normal range of motion.  Skin:    General: Skin is dry.  Neurological:     Mental Status: He is alert.     Coordination: Coordination normal.     Comments: Alert, answers questions. Purposeful movement of all extremities.    ED Results / Procedures / Treatments   Labs (all labs ordered are listed, but only abnormal results are displayed) Labs Reviewed  COMPREHENSIVE METABOLIC PANEL - Abnormal; Notable for the following components:      Result Value   Sodium 132 (*)    Chloride 97 (*)    Glucose, Bld 141 (*)    BUN 24 (*)    Calcium 8.1 (*)    Albumin 2.5 (*)    Total Bilirubin 5.3 (*)    All other components within normal limits  CBC WITH DIFFERENTIAL/PLATELET - Abnormal; Notable for the following components:   WBC 18.6 (*)    RBC 4.10 (*)    Hemoglobin 12.7 (*)    HCT 35.7 (*)    Neutro Abs 15.8 (*)    Monocytes Absolute 1.8 (*)    Abs Immature Granulocytes 0.27 (*)    All other components within normal limits  PROTIME-INR - Abnormal; Notable for the following components:   Prothrombin Time 15.3 (*)    All other components within normal limits  I-STAT CHEM 8, ED - Abnormal; Notable for the following components:   BUN 25 (*)    Glucose, Bld 140 (*)    Calcium, Ion 1.03 (*)    All other components within normal limits  RESP PANEL BY RT-PCR (FLU A&B, COVID) ARPGX2  CULTURE, BLOOD (ROUTINE X 2)  CULTURE, BLOOD (ROUTINE X 2)  URINE CULTURE  LACTIC ACID, PLASMA  APTT  ETHANOL  LACTIC ACID, PLASMA  URINALYSIS, ROUTINE W REFLEX MICROSCOPIC  RAPID URINE DRUG SCREEN, HOSP PERFORMED  BILIRUBIN, FRACTIONATED(TOT/DIR/INDIR)  TROPONIN I (HIGH SENSITIVITY)    EKG EKG  Interpretation  Date/Time:  Sunday April 25 2021 04:22:25 EST Ventricular Rate:  120 PR Interval:  123 QRS Duration: 80 QT Interval:  309 QTC Calculation: 437 R Axis:   77 Text Interpretation: Sinus tachycardia Baseline wander in lead(s) V1 Confirmed by Quintella Reichert [phone]) on 04/04/2021 4:38:49 AM  Radiology DG Chest Port 1 View  Result Date: 04/24/2021 CLINICAL DATA:  Questionable sepsis.  Shortness of breath. EXAM: PORTABLE CHEST  1 VIEW COMPARISON:  02/12/2017 FINDINGS: Consolidation noted throughout the left mid and lower lung, likely within the lingula compatible with pneumonia. Small left pleural effusion. Heart is normal size. Right lung is clear. IMPRESSION: Left side pneumonia, likely within the lingula. Small left effusion. Electronically Signed   By: Rolm Baptise M.D.   On: 04/14/2021 05:13    Procedures .Critical Care Performed by: Antonietta Breach, PA-C Authorized by: Antonietta Breach, PA-C   Critical care provider statement:    Critical care time (minutes):  75   Critical care time was exclusive of:  Separately billable procedures and treating other patients   Critical care was necessary to treat or prevent imminent or life-threatening deterioration of the following conditions:  Sepsis, shock and respiratory failure   Critical care was time spent personally by me on the following activities:  Development of treatment plan with patient or surrogate, discussions with consultants, evaluation of patient's response to treatment, examination of patient, ordering and review of laboratory studies, ordering and review of radiographic studies, ordering and performing treatments and interventions, pulse oximetry, re-evaluation of patient's condition, review of old charts and obtaining history from patient or surrogate   I assumed direction of critical care for this patient from another provider in my specialty: no     Care discussed with: admitting provider     Medications Ordered in  ED Medications  iohexol (OMNIPAQUE) 350 MG/ML injection 100 mL (has no administration in time range)  lactated ringers infusion ( Intravenous New Bag/Given 04/14/2021 0609)  azithromycin (ZITHROMAX) 500 mg in sodium chloride 0.9 % 250 mL IVPB (has no administration in time range)  vancomycin (VANCOREADY) IVPB 1250 mg/250 mL (1,250 mg Intravenous New Bag/Given 04/26/2021 0642)  lactated ringers bolus 1,000 mL (0 mLs Intravenous Stopped 04/22/2021 0642)  fentaNYL (SUBLIMAZE) injection 50 mcg (50 mcg Intravenous Given 04/19/2021 0450)  cefTRIAXone (ROCEPHIN) 1 g in sodium chloride 0.9 % 100 mL IVPB (1 g Intravenous New Bag/Given 04/15/2021 0529)    ED Course  I have reviewed the triage vital signs and the nursing notes.  Pertinent labs & imaging results that were available during my care of the patient were reviewed by me and considered in my medical decision making (see chart for details).  Clinical Course as of 04/27/2021 0701  Sun Apr 25, 2021  0455 Placed on BiPAP to help ease respiratory effort.  Patient tolerating well. [KH]  0518 Respiratory rate is improving a bit with BiPAP.  Chest x-ray at this time is concerning for left lower lobe pneumonia.  Small left effusion.  Pending CT for further characterization.  Antibiotics initiated given sepsis concerns. [KH]    Clinical Course User Index [KH] Beverely Pace   MDM Rules/Calculators/A&P                          25 year old male presents to the emergency department for shortness of breath, nausea, vomiting, diarrhea.  Began feeling ill 1 week ago.  Met criteria for sepsis on arrival with tachycardia, tachypnea.  He has not been febrile.  Is noted to have a leukocytosis of 18.  Sepsis appears secondary to community-acquired pneumonia affecting the left lung.  Initially hypoxic with EMS to 70% on room air.  Arrived speaking in truncated sentences despite 6 L via Ventimask.  Transitioned to BiPAP given increased work of breathing with improvement  in respiratory rate.  Antibiotics initiated.  Would advise critical care consultation to assess for admission to ICU  versus SDU.   CTA and CT abdomen pelvis pending at this time. Care signed out to [place], [place] at shift change.     Final Clinical Impression(s) / ED Diagnoses Final diagnoses:  Acute respiratory failure with hypoxia (HCC)  Sepsis, due to unspecified organism, unspecified whether acute organ dysfunction present [facility])  Community acquired pneumonia of left lower lobe of lung    Rx / DC Orders ED Discharge Orders     None        Antonietta Breach, PA-C 04/01/2021 0706    Quintella Reichert, MD 04/26/21 2256

## 2021-04-25 NOTE — ED Notes (Signed)
Lab to add on bilirubin

## 2021-04-25 NOTE — ED Provider Notes (Signed)
°  Physical Exam  BP (!) 136/93    Pulse (!) 120    Temp (!) 97.5 F (36.4 C) (Axillary)    Resp (!) 31    Ht 5\' 7"  (1.702 m)    SpO2 98%    BMI 21.93 kg/m   Physical Exam  ED Course/Procedures   Clinical Course as of 05/07/21 0736  Sun 05-07-21  0455 Placed on BiPAP to help ease respiratory effort.  Patient tolerating well. [KH]  0518 Respiratory rate is improving a bit with BiPAP.  Chest x-ray at this time is concerning for left lower lobe pneumonia.  Small left effusion.  Pending CT for further characterization.  Antibiotics initiated given sepsis concerns. [KH]    Clinical Course User Index [KH] Apr 27, 2021, PA-C    Procedures  MDM  Accepted handoff at shift change from HUMES PA-C. Please see prior provider note for more detail.   Briefly: Patient is 25 y.o. presenting to the ED via EMS with acute respiratory distress with hypoxia.   DDX: concern for pneumonia versus abdominal etiology  Plan: Awaiting CT chest and CT abdomen.  CT abdomen negative.  CT of his chest shows extensive cavitary opacity throughout the lingula concerning for cavitary/necrotic pneumonia.  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 The previous provider already ordered septic protocol with fluids and respiratory antibiotics.  The patient remains on BiPAP it is becoming slightly more tachypneic, but satting well.  Will page out to critical care medicine for admission.  Dr.MEIER from critical care medicine to admit.    34, PA-C 05/07/21 04/27/21    0349, MD 07-May-2021 [phone]

## 2021-04-25 NOTE — ED Notes (Signed)
MD, RT, RN X 2, pharm at bedside preparing to intubate.

## 2021-04-25 NOTE — ED Notes (Signed)
Pt transported to CT with this RN, RT, and NT

## 2021-04-25 NOTE — Progress Notes (Signed)
2 RRT's attempted to place a radial a-line with no success. PCCM placing a femoral a-line.

## 2021-04-26 DIAGNOSIS — A419 Sepsis, unspecified organism: Secondary | ICD-10-CM | POA: Diagnosis not present

## 2021-04-26 DIAGNOSIS — R6521 Severe sepsis with septic shock: Secondary | ICD-10-CM

## 2021-04-26 DIAGNOSIS — J9601 Acute respiratory failure with hypoxia: Secondary | ICD-10-CM | POA: Diagnosis not present

## 2021-04-26 LAB — POCT I-STAT 7, (LYTES, BLD GAS, ICA,H+H)
Acid-Base Excess: 1 mmol/L (ref 0.0–2.0)
Acid-base deficit: 2 mmol/L (ref 0.0–2.0)
Bicarbonate: 24.7 mmol/L (ref 20.0–28.0)
Bicarbonate: 25.9 mmol/L (ref 20.0–28.0)
Calcium, Ion: 1.09 mmol/L — ABNORMAL LOW (ref 1.15–1.40)
Calcium, Ion: 1.15 mmol/L (ref 1.15–1.40)
HCT: 30 % — ABNORMAL LOW (ref 39.0–52.0)
HCT: 29 % — ABNORMAL LOW (ref 39.0–52.0)
Hemoglobin: 10.2 g/dL — ABNORMAL LOW (ref 13.0–17.0)
Hemoglobin: 9.9 g/dL — ABNORMAL LOW (ref 13.0–17.0)
O2 Saturation: 95 %
O2 Saturation: 99 %
Patient temperature: 36.9
Patient temperature: 37.1
Potassium: 4.7 mmol/L (ref 3.5–5.1)
Potassium: 4.3 mmol/L (ref 3.5–5.1)
Sodium: 137 mmol/L (ref 135–145)
Sodium: 138 mmol/L (ref 135–145)
TCO2: 26 mmol/L (ref 22–32)
TCO2: 27 mmol/L (ref 22–32)
pCO2 arterial: 47.1 mmHg (ref 32.0–48.0)
pCO2 arterial: 40.6 mmHg (ref 32.0–48.0)
pH, Arterial: 7.327 — ABNORMAL LOW (ref 7.350–7.450)
pH, Arterial: 7.413 (ref 7.350–7.450)
pO2, Arterial: 82 mmHg — ABNORMAL LOW (ref 83.0–108.0)
pO2, Arterial: 125 mmHg — ABNORMAL HIGH (ref 83.0–108.0)

## 2021-04-26 LAB — BASIC METABOLIC PANEL
Anion gap: 12 (ref 5–15)
Anion gap: 9 (ref 5–15)
BUN: 24 mg/dL — ABNORMAL HIGH (ref 6–20)
BUN: 25 mg/dL — ABNORMAL HIGH (ref 6–20)
CO2: 21 mmol/L — ABNORMAL LOW (ref 22–32)
CO2: 24 mmol/L (ref 22–32)
Calcium: 7.5 mg/dL — ABNORMAL LOW (ref 8.9–10.3)
Calcium: 8 mg/dL — ABNORMAL LOW (ref 8.9–10.3)
Chloride: 103 mmol/L (ref 98–111)
Chloride: 104 mmol/L (ref 98–111)
Creatinine, Ser: 1.11 mg/dL (ref 0.61–1.24)
Creatinine, Ser: 1.14 mg/dL (ref 0.61–1.24)
GFR, Estimated: >60 mL/min (ref >60)
GFR, Estimated: >60 mL/min (ref >60)
Glucose, Bld: 165 mg/dL — ABNORMAL HIGH (ref 70–99)
Glucose, Bld: 157 mg/dL — ABNORMAL HIGH (ref 70–99)
Potassium: 4.7 mmol/L (ref 3.5–5.1)
Potassium: 4.5 mmol/L (ref 3.5–5.1)
Sodium: 136 mmol/L (ref 135–145)
Sodium: 137 mmol/L (ref 135–145)

## 2021-04-26 LAB — CBC
HCT: 30.3 % — ABNORMAL LOW (ref 39.0–52.0)
Hemoglobin: 10.3 g/dL — ABNORMAL LOW (ref 13.0–17.0)
MCH: 31.2 pg (ref 26.0–34.0)
MCHC: 34 g/dL (ref 30.0–36.0)
MCV: 91.8 fL (ref 80.0–100.0)
Platelets: 276 10*3/uL (ref 150–400)
RBC: 3.3 MIL/uL — ABNORMAL LOW (ref 4.22–5.81)
RDW: 12.7 % (ref 11.5–15.5)
WBC: 17.7 10*3/uL — ABNORMAL HIGH (ref 4.0–10.5)
nRBC: 0 % (ref 0.0–0.2)

## 2021-04-26 LAB — LACTIC ACID, PLASMA
Lactic Acid, Venous: 4.9 mmol/L (ref 0.5–1.9)
Lactic Acid, Venous: 3.9 mmol/L (ref 0.5–1.9)

## 2021-04-26 LAB — MAGNESIUM
Magnesium: 2.5 mg/dL — ABNORMAL HIGH (ref 1.7–2.4)
Magnesium: 2.9 mg/dL — ABNORMAL HIGH (ref 1.7–2.4)

## 2021-04-26 LAB — URINE CULTURE: Culture: NO GROWTH

## 2021-04-26 LAB — PHOSPHORUS
Phosphorus: 4.1 mg/dL (ref 2.5–4.6)
Phosphorus: 3.6 mg/dL (ref 2.5–4.6)

## 2021-04-26 LAB — GLUCOSE, CAPILLARY
Glucose-Capillary: 170 mg/dL — ABNORMAL HIGH (ref 70–99)
Glucose-Capillary: 172 mg/dL — ABNORMAL HIGH (ref 70–99)
Glucose-Capillary: 159 mg/dL — ABNORMAL HIGH (ref 70–99)
Glucose-Capillary: 146 mg/dL — ABNORMAL HIGH (ref 70–99)
Glucose-Capillary: 186 mg/dL — ABNORMAL HIGH (ref 70–99)
Glucose-Capillary: 185 mg/dL — ABNORMAL HIGH (ref 70–99)

## 2021-04-26 LAB — HEPATIC FUNCTION PANEL
ALT: 23 U/L (ref 0–44)
AST: 43 U/L — ABNORMAL HIGH (ref 15–41)
Albumin: 1.7 g/dL — ABNORMAL LOW (ref 3.5–5.0)
Alkaline Phosphatase: 26 U/L — ABNORMAL LOW (ref 38–126)
Bilirubin, Direct: 3.5 mg/dL — ABNORMAL HIGH (ref 0.0–0.2)
Indirect Bilirubin: 1.7 mg/dL — ABNORMAL HIGH (ref 0.3–0.9)
Total Bilirubin: 5.2 mg/dL — ABNORMAL HIGH (ref 0.3–1.2)
Total Protein: 4.9 g/dL — ABNORMAL LOW (ref 6.5–8.1)

## 2021-04-26 LAB — TRIGLYCERIDES: Triglycerides: 132 mg/dL (ref <150)

## 2021-04-26 MED ORDER — PANTOPRAZOLE SODIUM 40 MG IV SOLR
40.0000 mg | INTRAVENOUS | Status: DC
Start: 2021-04-26 — End: 2021-04-27
  Administered 2021-04-26: 11:00:00 40 mg via INTRAVENOUS
  Filled 2021-04-26: qty 40

## 2021-04-26 MED ORDER — CALCIUM GLUCONATE-NACL 2-0.675 GM/100ML-% IV SOLN
2.0000 g | Freq: Once | INTRAVENOUS | Status: AC
  Administered 2021-04-26: 11:00:00 2000 mg via INTRAVENOUS
  Filled 2021-04-26 (×2): qty 100

## 2021-04-26 MED ORDER — FUROSEMIDE 10 MG/ML IJ SOLN
40.0000 mg | Freq: Once | INTRAMUSCULAR | Status: AC
  Administered 2021-04-26: 09:00:00 40 mg via INTRAVENOUS
  Filled 2021-04-26: qty 4

## 2021-04-26 MED ORDER — PIVOT 1.5 CAL PO LIQD
1000.0000 mL | ORAL | Status: DC
  Administered 2021-04-26 – 2021-05-02 (×10): 1000 mL
  Filled 2021-04-26 (×10): qty 1000

## 2021-04-26 MED ORDER — ASPIRIN 325 MG PO TABS
325.0000 mg | ORAL_TABLET | Freq: Once | ORAL | Status: DC
  Filled 2021-04-26: qty 1

## 2021-04-26 MED ORDER — SODIUM CHLORIDE 0.9% FLUSH
10.0000 mL | Freq: Two times a day (BID) | INTRAVENOUS | Status: DC
  Administered 2021-04-26 – 2021-05-09 (×22): 10 mL

## 2021-04-26 MED ORDER — SODIUM CHLORIDE 0.9% FLUSH: 10.0000 mL | INTRAVENOUS | Status: DC | PRN

## 2021-04-26 NOTE — Plan of Care (Signed)
Problem: Education: Goal: Knowledge of General Education information will improve Description: Including pain rating scale, medication(s)/side effects and non-pharmacologic comfort measures Outcome: Not Progressing   Problem: Health Behavior/Discharge Planning: Goal: Ability to manage health-related needs will improve Outcome: Not Progressing   Problem: Clinical Measurements: Goal: Ability to maintain clinical measurements within normal limits will improve Outcome: Not Progressing Goal: Will remain free from infection Outcome: Not Progressing Goal: Diagnostic test results will improve Outcome: Not Progressing Goal: Respiratory complications will improve Outcome: Not Progressing Goal: Cardiovascular complication will be avoided Outcome: Not Progressing   Problem: Activity: Goal: Risk for activity intolerance will decrease Outcome: Not Progressing   Problem: Nutrition: Goal: Adequate nutrition will be maintained Outcome: Not Progressing   Problem: Coping: Goal: Level of anxiety will decrease Outcome: Not Progressing   Problem: Elimination: Goal: Will not experience complications related to bowel motility Outcome: Not Progressing Goal: Will not experience complications related to urinary retention Outcome: Not Progressing   Problem: Pain Managment: Goal: General experience of comfort will improve Outcome: Not Progressing   Problem: Safety: Goal: Ability to remain free from injury will improve Outcome: Not Progressing   Problem: Skin Integrity: Goal: Risk for impaired skin integrity will decrease Outcome: Not Progressing   Problem: Safety: Goal: Non-violent Restraint(s) Outcome: Completed/Met

## 2021-04-26 NOTE — Progress Notes (Addendum)
NAME:  Mathew Cooke, MRN:  [id number], DOB:  1995/08/15, LOS: 1 ADMISSION DATE:  05/20/2021, CONSULTATION DATE:  05/20/21 REFERRING MD:  Alison Murray, CHIEF COMPLAINT:  dyspnea   History of Present Illness:  25 y o M with history of schizoaffective disorder, prior suicide attempt who was BIBEMS from home for dyspnea which had worsened over 2-3d PTA. History obtained thru chart review has he is in extremis from respiratory standpoint at time of my evaluation. Nausea, several episodes of emesis today. Left sided pleuritic CP. EMS gave him a liter of fluid.  In ED he was found to have necrotizing pneumonia and was given 1L LR, vanc, ceftriaxone and placed on nasal cannula which was escalated to BiPAP which he failed requiring endotracheal intubation.   Pertinent  Medical History  Schizoaffective disorder, prior suicide attempt  Significant Hospital Events: Including procedures, antibiotic start and stop dates in addition to other pertinent events   12/25 admitted, ETT placed   Interim History / Subjective:  Patient's vasopressor requirement is improving He remained in ARDS On paralytic and deep sedation  Objective   Blood pressure 100/66, pulse (!) 104, temperature 98.4 F (36.9 C), resp. rate (!) 24, height 5\' 7"  (1.702 m), weight 72.8 kg, SpO2 91 %.    Vent Mode: PRVC FiO2 (%):  [60 %-100 %] 60 % Set Rate:  [16 bmp-24 bmp] 24 bmp Vt Set:  [530 mL] 530 mL PEEP:  [8 cmH20-14 cmH20] 14 cmH20 Plateau Pressure:  [21 cmH20-32 cmH20] 31 cmH20   Intake/Output Summary (Last 24 hours) at 04/26/2021 0944 Last data filed at 04/26/2021 0900 Gross per 24 hour  Intake 5223.91 ml  Output 1855 ml  Net 3368.91 ml   Filed Weights   20-May-2021 0927 04/26/21 0500  Weight: 63.5 kg 72.8 kg    Examination: General: Crtitically ill-appearing young male, orally intubated HEENT: [place]/AT, eyes anicteric.  ETT and OGT in place Neuro: Sedated, not following commands.  Eyes are closed.  Pupils 3 mm  bilateral reactive to light Chest: Bilateral basal crackles, no wheezes or rhonchi Heart: Regular rate and rhythm, no murmurs or gallops Abdomen: Soft, nontender, nondistended, bowel sounds present Skin: No rash  Resolved Hospital Problem list     Assessment & Plan:  Severe sepsis with septic shock and acute hypoxemic respiratory failure ARDS due to Necrotizing pneumonia with polymicrobial's Left basilar pneumothorax/pyopneumothorax Lactic acidosis Continue lung protective ventilation Patient's O2 sat has improved, yesterday he was setting up to 70s Now PaO2 73 on 60% FiO2 Peak and plateau pressures are at goal Continue epo CT chest confirmed bilateral pulmonary infiltrates with necrotizing pneumonia of left Continue antibiotics with ceftriaxone, vancomycin and Flagyl Currently on neuromuscular blocker with vecuronium Continue Versed and fentanyl with RASS goal -/-5 Still on Levophed and vasopressin Came off of phenylephrine Patient serum lactate started trending up again despite his vasopressor requirement is coming down and he is afebrile Closely monitor He received albumin  Acute kidney injury Hypocalcemia Shock liver Serum creatinine started trending down Closely monitor intake and output Avoid nephrotoxic agents Continue electrolyte supplement Monitor LFTs  Schizoaffective disorder Hold his home meds for now  Best Practice (right click and "Reselect all SmartList Selections" daily)   Diet/type: tubefeeds DVT prophylaxis: LMWH GI prophylaxis: Protonix Lines: Yes needed Foley: Yes needed Code Status:  full code Last date of multidisciplinary goals of care discussion [12/25: Patient mother was updated, decision was to continue full scope of care while keeping him full code Labs   CBC: Recent  Labs  Lab May 07, 2021 0426 05-07-21 0456 May 07, 2021 1111 07-May-2021 1115 05/07/21 1701 07-May-2021 1825 04/26/21 0307 04/26/21 0614  WBC 18.6*  --  7.8  --   --   --  17.7*   --   NEUTROABS 15.8*  --   --   --   --   --   --   --   HGB 12.7*   < > 11.7* 11.2* 10.5* 9.5* 10.3* 10.2*  HCT 35.7*   < > 33.2* 33.0* 31.0* 28.0* 30.3* 30.0*  MCV 87.1  --  88.1  --   --   --  91.8  --   PLT 342  --  284  --   --   --  276  --    < > = values in this interval not displayed.    Basic Metabolic Panel: Recent Labs  Lab 07-May-2021 0426 May 07, 2021 0456 05-07-21 0739 07-May-2021 1111 May 07, 2021 1115 05-07-21 1701 2021-05-07 1704 May 07, 2021 1825 04/26/21 0307 04/26/21 0614  NA 132* 136   < >  --  135 139  --  137 136 137  K 3.5 3.5   < >  --  4.0 4.2  --  3.6 4.7 4.7  CL 97* 98  --   --   --   --   --   --  103  --   CO2 25  --   --   --   --   --   --   --  21*  --   GLUCOSE 141* 140*  --   --   --   --   --   --  165*  --   BUN 24* 25*  --   --   --   --   --   --  24*  --   CREATININE 0.97 0.80  --  0.97  --   --   --   --  1.11  --   CALCIUM 8.1*  --   --   --   --   --   --   --  7.5*  --   MG  --   --   --   --   --   --  1.8  --  2.5*  --    < > = values in this interval not displayed.   GFR: Estimated Creatinine Clearance: 93.5 mL/min (by C-G formula based on SCr of 1.11 mg/dL). Recent Labs  Lab 2021/05/07 0426 07-May-2021 1111 2021/05/07 1655 May 07, 2021 2151 04/26/21 0307  WBC 18.6* 7.8  --   --  17.7*  LATICACIDVEN 1.6  --  4.0* 4.4* 4.9*    Liver Function Tests: Recent Labs  Lab 05/07/2021 0426 04/26/21 0307  AST 26 43*  ALT 18 23  ALKPHOS 56 26*  BILITOT 5.1*   5.3* 5.2*  PROT 7.1 4.9*  ALBUMIN 2.5* 1.7*   No results for input(s): LIPASE, AMYLASE in the last 168 hours. No results for input(s): AMMONIA in the last 168 hours.  ABG    Component Value Date/Time   PHART 7.327 (L) 04/26/2021 0614   PCO2ART 47.1 04/26/2021 0614   PO2ART 82 (L) 04/26/2021 0614   HCO3 24.7 04/26/2021 0614   TCO2 26 04/26/2021 0614   ACIDBASEDEF 2.0 04/26/2021 0614   O2SAT 95.0 04/26/2021 0614     Coagulation Profile: Recent Labs  Lab 2021-05-07 0426  INR 1.2     Cardiac Enzymes: No results for input(s): CKTOTAL, CKMB,  CKMBINDEX, TROPONINI in the last 168 hours.  HbA1C: No results found for: HGBA1C  CBG: Recent Labs  Lab 04/12/2021 1128 04/19/2021 1947 04/26/21 0356 04/26/21 0754  GLUCAP 133* 143* 170* 172*    Critical care time:     Total critical care time: 49 minutes  Performed by: Cheri Fowler   Critical care time was exclusive of separately billable procedures and treating other patients.   Critical care was necessary to treat or prevent imminent or life-threatening deterioration.   Critical care was time spent personally by me on the following activities: development of treatment plan with patient and/or surrogate as well as nursing, discussions with consultants, evaluation of patient's response to treatment, examination of patient, obtaining history from patient or surrogate, ordering and performing treatments and interventions, ordering and review of laboratory studies, ordering and review of radiographic studies, pulse oximetry and re-evaluation of patient's condition.   Cheri Fowler MD [facility] See Amion for pager If no response to pager, please call [phone] until 7pm After 7pm, Please call E-link [phone]

## 2021-04-26 NOTE — Progress Notes (Signed)
During morning shift assessment, patient had 0/4 twitches on TOF Q 15 minutes x 1 hour . Notified MD. Received order from Dr. Merrily Pew to discontinue Vec. Continued to check TOF with no twitches noted. MD aware.

## 2021-04-26 NOTE — Progress Notes (Signed)
Initial Nutrition Assessment  DOCUMENTATION CODES:   Not applicable  INTERVENTION:   Initiate tube feeding via OG tube: Pivot 1.5 at 30 ml/h increase by 10 ml every 8 hours to goal rate of 60 ml/hr (1440 ml per day)  Provides 2160 kcal, 135 gm protein, 1090 ml free water daily  Monitor magnesium and phosphorus every 12 hours x 4 occurrences, MD to replete as needed, as pt is at risk for refeeding syndrome given unknown nutrition hx on admission.   NUTRITION DIAGNOSIS:   Increased nutrient needs related to acute illness as evidenced by estimated needs.  GOAL:   Patient will meet greater than or equal to 90% of their needs  MONITOR:   TF tolerance, Labs  REASON FOR ASSESSMENT:   Consult Enteral/tube feeding initiation and management  ASSESSMENT:   Pt with PMH of schizoaffective disorder and prior suicide attempt admitted with necrotizing PNA/ARDS.    Paralytic has been weaned off currently. Currently on 70% fiO2.   Patient is currently intubated on ventilator support MV: 12.7 L/min Temp (24hrs), Avg:98.6 F (37 C), Min:96.1 F (35.6 C), Max:104.6 F (40.3 C)    Medications reviewed and include: colace, solu-cortef, miralax, protonix Calcium gluconate x 1 Fentanyl  Levophed @ 27 mcg  Vasopressin  Labs reviewed:  CBG's: 170-172  16 F OG tube: tip within stomach per xray   NUTRITION - FOCUSED PHYSICAL EXAM:  Remote   Diet Order:   Diet Order             Diet NPO time specified  Diet effective now                   EDUCATION NEEDS:   No education needs have been identified at this time  Skin:  Skin Assessment: Reviewed RN Assessment  Last BM:  unknown  Height:   Ht Readings from Last 1 Encounters:  04/01/2021 5\' 7"  (1.702 m)    Weight:   Wt Readings from Last 1 Encounters:  04/26/21 72.8 kg    Ideal Body Weight:     BMI:  Body mass index is 25.14 kg/m.  Estimated Nutritional Needs:   Kcal:  2000-2300  Protein:  115-135  grams  Fluid:  > 2 L/day  04/28/21., RD, LDN, CNSC See AMiON for contact information

## 2021-04-27 ENCOUNTER — Inpatient Hospital Stay (HOSPITAL_COMMUNITY): Payer: Medicare Other

## 2021-04-27 DIAGNOSIS — J80 Acute respiratory distress syndrome: Secondary | ICD-10-CM | POA: Diagnosis not present

## 2021-04-27 DIAGNOSIS — R579 Shock, unspecified: Secondary | ICD-10-CM | POA: Diagnosis not present

## 2021-04-27 DIAGNOSIS — R6521 Severe sepsis with septic shock: Secondary | ICD-10-CM

## 2021-04-27 DIAGNOSIS — Z9911 Dependence on respirator [ventilator] status: Secondary | ICD-10-CM

## 2021-04-27 DIAGNOSIS — J9601 Acute respiratory failure with hypoxia: Secondary | ICD-10-CM

## 2021-04-27 LAB — BASIC METABOLIC PANEL
Anion gap: 7 (ref 5–15)
BUN: 34 mg/dL — ABNORMAL HIGH (ref 6–20)
CO2: 28 mmol/L (ref 22–32)
Calcium: 7.7 mg/dL — ABNORMAL LOW (ref 8.9–10.3)
Chloride: 106 mmol/L (ref 98–111)
Creatinine, Ser: 0.88 mg/dL (ref 0.61–1.24)
GFR, Estimated: >60 mL/min (ref >60)
Glucose, Bld: 195 mg/dL — ABNORMAL HIGH (ref 70–99)
Potassium: 4 mmol/L (ref 3.5–5.1)
Sodium: 141 mmol/L (ref 135–145)

## 2021-04-27 LAB — CBC
HCT: 26.9 % — ABNORMAL LOW (ref 39.0–52.0)
Hemoglobin: 9.1 g/dL — ABNORMAL LOW (ref 13.0–17.0)
MCH: 30.1 pg (ref 26.0–34.0)
MCHC: 33.8 g/dL (ref 30.0–36.0)
MCV: 89.1 fL (ref 80.0–100.0)
Platelets: 301 10*3/uL (ref 150–400)
RBC: 3.02 MIL/uL — ABNORMAL LOW (ref 4.22–5.81)
RDW: 12.5 % (ref 11.5–15.5)
WBC: 25 10*3/uL — ABNORMAL HIGH (ref 4.0–10.5)
nRBC: 0 % (ref 0.0–0.2)

## 2021-04-27 LAB — POCT I-STAT 7, (LYTES, BLD GAS, ICA,H+H)
Acid-Base Excess: 6 mmol/L — ABNORMAL HIGH (ref 0.0–2.0)
Acid-Base Excess: 11 mmol/L — ABNORMAL HIGH (ref 0.0–2.0)
Acid-base deficit: 1 mmol/L (ref 0.0–2.0)
Bicarbonate: 24.8 mmol/L (ref 20.0–28.0)
Bicarbonate: 31.2 mmol/L — ABNORMAL HIGH (ref 20.0–28.0)
Bicarbonate: 35.9 mmol/L — ABNORMAL HIGH (ref 20.0–28.0)
Calcium, Ion: 1.12 mmol/L — ABNORMAL LOW (ref 1.15–1.40)
Calcium, Ion: 1.13 mmol/L — ABNORMAL LOW (ref 1.15–1.40)
Calcium, Ion: 1.14 mmol/L — ABNORMAL LOW (ref 1.15–1.40)
HCT: 30 % — ABNORMAL LOW (ref 39.0–52.0)
HCT: 26 % — ABNORMAL LOW (ref 39.0–52.0)
HCT: 25 % — ABNORMAL LOW (ref 39.0–52.0)
Hemoglobin: 10.2 g/dL — ABNORMAL LOW (ref 13.0–17.0)
Hemoglobin: 8.8 g/dL — ABNORMAL LOW (ref 13.0–17.0)
Hemoglobin: 8.5 g/dL — ABNORMAL LOW (ref 13.0–17.0)
O2 Saturation: 93 %
O2 Saturation: 99 %
O2 Saturation: 99 %
Patient temperature: 36.9
Patient temperature: 37
Patient temperature: 38.3
Potassium: 4.6 mmol/L (ref 3.5–5.1)
Potassium: 3.9 mmol/L (ref 3.5–5.1)
Potassium: 4 mmol/L (ref 3.5–5.1)
Sodium: 137 mmol/L (ref 135–145)
Sodium: 142 mmol/L (ref 135–145)
Sodium: 143 mmol/L (ref 135–145)
TCO2: 26 mmol/L (ref 22–32)
TCO2: 33 mmol/L — ABNORMAL HIGH (ref 22–32)
TCO2: 37 mmol/L — ABNORMAL HIGH (ref 22–32)
pCO2 arterial: 46.8 mmHg (ref 32.0–48.0)
pCO2 arterial: 49.8 mmHg — ABNORMAL HIGH (ref 32.0–48.0)
pCO2 arterial: 53.8 mmHg — ABNORMAL HIGH (ref 32.0–48.0)
pH, Arterial: 7.332 — ABNORMAL LOW (ref 7.350–7.450)
pH, Arterial: 7.405 (ref 7.350–7.450)
pH, Arterial: 7.437 (ref 7.350–7.450)
pO2, Arterial: 73 mmHg — ABNORMAL LOW (ref 83.0–108.0)
pO2, Arterial: 118 mmHg — ABNORMAL HIGH (ref 83.0–108.0)
pO2, Arterial: 159 mmHg — ABNORMAL HIGH (ref 83.0–108.0)

## 2021-04-27 LAB — HEPATIC FUNCTION PANEL
ALT: 23 U/L (ref 0–44)
AST: 36 U/L (ref 15–41)
Albumin: 1.7 g/dL — ABNORMAL LOW (ref 3.5–5.0)
Alkaline Phosphatase: 58 U/L (ref 38–126)
Bilirubin, Direct: 1.8 mg/dL — ABNORMAL HIGH (ref 0.0–0.2)
Indirect Bilirubin: 1.1 mg/dL — ABNORMAL HIGH (ref 0.3–0.9)
Total Bilirubin: 2.9 mg/dL — ABNORMAL HIGH (ref 0.3–1.2)
Total Protein: 5.2 g/dL — ABNORMAL LOW (ref 6.5–8.1)

## 2021-04-27 LAB — ECHOCARDIOGRAM COMPLETE
AR max vel: 2.57 cm2
AV Peak grad: 5.5 mmHg
Ao pk vel: 1.17 m/s
Area-P 1/2: 5.7 cm2
Calc EF: 47.1 %
Height: 67 in
S' Lateral: 3.5 cm
Single Plane A2C EF: 48.9 %
Single Plane A4C EF: 47.5 %
Weight: 2564.39 oz

## 2021-04-27 LAB — HEMOGLOBIN A1C
Hgb A1c MFr Bld: 4.8 % (ref 4.8–5.6)
Mean Plasma Glucose: 91.06 mg/dL

## 2021-04-27 LAB — GLUCOSE, CAPILLARY
Glucose-Capillary: 190 mg/dL — ABNORMAL HIGH (ref 70–99)
Glucose-Capillary: 174 mg/dL — ABNORMAL HIGH (ref 70–99)
Glucose-Capillary: 136 mg/dL — ABNORMAL HIGH (ref 70–99)
Glucose-Capillary: 138 mg/dL — ABNORMAL HIGH (ref 70–99)
Glucose-Capillary: 157 mg/dL — ABNORMAL HIGH (ref 70–99)

## 2021-04-27 LAB — PHOSPHORUS
Phosphorus: 2.4 mg/dL — ABNORMAL LOW (ref 2.5–4.6)
Phosphorus: 2.1 mg/dL — ABNORMAL LOW (ref 2.5–4.6)

## 2021-04-27 LAB — MAGNESIUM
Magnesium: 3.3 mg/dL — ABNORMAL HIGH (ref 1.7–2.4)
Magnesium: 3.3 mg/dL — ABNORMAL HIGH (ref 1.7–2.4)

## 2021-04-27 LAB — LACTIC ACID, PLASMA: Lactic Acid, Venous: 2.2 mmol/L (ref 0.5–1.9)

## 2021-04-27 MED ORDER — CALCIUM GLUCONATE-NACL 1-0.675 GM/50ML-% IV SOLN
1.0000 g | Freq: Once | INTRAVENOUS | Status: AC
  Administered 2021-04-27: 12:00:00 1000 mg via INTRAVENOUS
  Filled 2021-04-27: qty 50

## 2021-04-27 MED ORDER — PANTOPRAZOLE 2 MG/ML SUSPENSION
40.0000 mg | Freq: Every day | ORAL | Status: DC
  Administered 2021-04-27 – 2021-05-09 (×13): 40 mg
  Filled 2021-04-27 (×13): qty 20

## 2021-04-27 MED ORDER — INSULIN DETEMIR 100 UNIT/ML ~~LOC~~ SOLN
5.0000 [IU] | Freq: Every day | SUBCUTANEOUS | Status: DC
  Administered 2021-04-27 – 2021-04-30 (×4): 5 [IU] via SUBCUTANEOUS
  Filled 2021-04-27 (×5): qty 0.05

## 2021-04-27 MED ORDER — VECURONIUM BROMIDE 10 MG IV SOLR: 0.1000 mg/kg | INTRAVENOUS | Status: DC | PRN

## 2021-04-27 MED ORDER — INSULIN ASPART 100 UNIT/ML IJ SOLN
0.0000 [IU] | INTRAMUSCULAR | Status: DC
  Administered 2021-04-27: 23:00:00 3 [IU] via SUBCUTANEOUS
  Administered 2021-04-27: 20:00:00 2 [IU] via SUBCUTANEOUS
  Administered 2021-04-27: 12:00:00 3 [IU] via SUBCUTANEOUS
  Administered 2021-04-27 – 2021-04-30 (×6): 2 [IU] via SUBCUTANEOUS
  Administered 2021-04-30 (×2): 3 [IU] via SUBCUTANEOUS
  Administered 2021-04-30: 04:00:00 2 [IU] via SUBCUTANEOUS
  Administered 2021-05-01: 1 [IU] via SUBCUTANEOUS
  Administered 2021-05-03: 3 [IU] via SUBCUTANEOUS
  Administered 2021-05-03: 2 [IU] via SUBCUTANEOUS
  Administered 2021-05-03: 3 [IU] via SUBCUTANEOUS
  Administered 2021-05-03: 2 [IU] via SUBCUTANEOUS
  Administered 2021-05-03: 3 [IU] via SUBCUTANEOUS
  Administered 2021-05-03: 2 [IU] via SUBCUTANEOUS
  Administered 2021-05-03 – 2021-05-04 (×2): 3 [IU] via SUBCUTANEOUS
  Administered 2021-05-04 (×2): 2 [IU] via SUBCUTANEOUS
  Administered 2021-05-04 – 2021-05-05 (×5): 3 [IU] via SUBCUTANEOUS
  Administered 2021-05-05: 5 [IU] via SUBCUTANEOUS
  Administered 2021-05-05 – 2021-05-06 (×4): 3 [IU] via SUBCUTANEOUS
  Administered 2021-05-06: 2 [IU] via SUBCUTANEOUS
  Administered 2021-05-06: 3 [IU] via SUBCUTANEOUS
  Administered 2021-05-06: 2 [IU] via SUBCUTANEOUS
  Administered 2021-05-07: 3 [IU] via SUBCUTANEOUS
  Administered 2021-05-07 (×4): 2 [IU] via SUBCUTANEOUS
  Administered 2021-05-08: 3 [IU] via SUBCUTANEOUS
  Administered 2021-05-08: 2 [IU] via SUBCUTANEOUS
  Administered 2021-05-08: 3 [IU] via SUBCUTANEOUS
  Administered 2021-05-09 (×2): 2 [IU] via SUBCUTANEOUS

## 2021-04-27 NOTE — Progress Notes (Addendum)
NAME:  Mathew Cooke, MRN:  [id number], DOB:  09/03/1995, LOS: 2 ADMISSION DATE:  04/05/2021, CONSULTATION DATE:  04/28/2021 REFERRING MD:  Alison Murray, CHIEF COMPLAINT:  dyspnea   History of Present Illness:  25 y o M with history of schizoaffective disorder, prior suicide attempt who was BIBEMS from home for dyspnea which had worsened over 2-3d PTA. History obtained thru chart review has he is in extremis from respiratory standpoint at time of my evaluation. Nausea, several episodes of emesis today. Left sided pleuritic CP. EMS gave him a liter of fluid.  In ED he was found to have necrotizing pneumonia and was given 1L LR, vanc, ceftriaxone and placed on nasal cannula which was escalated to BiPAP which he failed requiring endotracheal intubation.   Pertinent  Medical History  Schizoaffective disorder, prior suicide attempt  Significant Hospital Events: Including procedures, antibiotic start and stop dates in addition to other pertinent events   12/25 admitted, ETT placed. Started on veletri. Vec gtt. ECMO consult -- not indicated at time of consult. NE, neo, vaso, solucortef  12/26 off vec gtt. Off neo  12/27 weaning veletri. Dc solucortef   Interim History / Subjective:   FiO2 60% PEEP 14 On inhaled epo   Objective   Blood pressure 128/77, pulse 87, temperature 98.2 F (36.8 C), resp. rate (!) 24, height 5\' 7"  (1.702 m), weight 72.7 kg, SpO2 95 %.    Vent Mode: PRVC FiO2 (%):  [60 %-70 %] 60 % Set Rate:  [24 bmp] 24 bmp Vt Set:  [530 mL] 530 mL PEEP:  [14 cmH20] 14 cmH20 Plateau Pressure:  [27 cmH20-31 cmH20] 27 cmH20   Intake/Output Summary (Last 24 hours) at 04/27/2021 1205 Last data filed at 04/27/2021 1100 Gross per 24 hour  Intake 3240.64 ml  Output 2695 ml  Net 545.64 ml   Filed Weights   04/30/2021 0927 04/26/21 0500 04/27/21 0500  Weight: 63.5 kg 72.8 kg 72.7 kg    Examination: General: Critically ill young adult M, intubated deeply sedated NAD  HEENT: NCAT ETT  secure. Anicteric sclera  Neuro: Sedated. + corneal reflex. No response to pain.  Chest: Bilateral crackles and coarse sounds. Mechanically ventilated. Symmetrical chest expansion  Heart: RRR distant heart sounds cap refill < 3 sec.  Abdomen: arctic sun obscuring exam. Appears flat and soft Skin: c/d/w no rash. 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 pCO2 up to 49 from 40.  -unclear etiology of this nec PNA in what seems to be a young healthy M without significant PMH, without immunocompromised state, without IVDU hx etc. P -cont lung protective ventilation -starting 12/27 -- will begin to wean veletri  -cont RASS -4/-5 -dc continuous vec, keep PRN  -cont broad abx  -AM CXR  -12/17 afternoon ABG   Septic shock due to necrotizing PNA with multisystem organ dysfunction -AKI, shock liver, respiratory failure  P -cont broad abx at this time  -continue vaso, levo -- wean as able -trend CBC, fever curve  -dc arctic sun -dc solucortef  -will get an ECHO  AKI due to septic shock - improving P -trend renal induces, UOP  -minimize nephrotoxins as able   Shock liver -  improving P -trend LFTs   Hypocalcemia -ical 1.13 P -replace 12/17  Lactic acidosis -started to come down 12/26  P -recheck 12/27   Hyperglycemia -critical illness, steroids, enteral nutrition P -SSI  Schizoaffective disorder -on seroquel, prozac   Best Practice (right click and "Reselect all SmartList Selections" daily)   Diet/type: tubefeeds DVT prophylaxis: LMWH GI prophylaxis: Protonix Lines: Yes needed Foley: Yes needed Code Status:  full code Last date of  multidisciplinary goals of care discussion [12/25: Patient mother was updated, decision was to continue full scope of care while keeping him full code  Labs   CBC: Recent Labs  Lab 04/18/2021 0426 04/07/2021 0456 04/23/2021 1111 04/10/2021 1115 04/26/21 0307 04/26/21 0614 04/26/21 0916 04/26/21 1434 04/27/21 0308 04/27/21 0317  WBC 18.6*  --  7.8  --  17.7*  --   --   --  25.0*  --   NEUTROABS 15.8*  --   --   --   --   --   --   --   --   --   HGB 12.7*   < > 11.7*   < > 10.3* 10.2* 10.2* 9.9* 9.1* 8.8*  HCT 35.7*   < > 33.2*   < > 30.3* 30.0* 30.0* 29.0* 26.9* 26.0*  MCV 87.1  --  88.1  --  91.8  --   --   --  89.1  --   PLT 342  --  284  --  276  --   --   --  301  --    < > = values in this interval not displayed.    Basic Metabolic Panel: Recent Labs  Lab 04/21/2021 0426 04/22/2021 0456 04/24/2021 0739 04/14/2021 1111 04/07/2021 1115 04/21/2021 1704 04/17/2021 1825 04/26/21 0307 04/26/21 0614 04/26/21 0916 04/26/21 1141 04/26/21 1405 04/26/21 1434 04/27/21 0308 04/27/21 0317  NA 132* 136   < >  --    < >  --    < > 136   < > 137  --  137 138 141 142  K 3.5 3.5   < >  --    < >  --    < > 4.7   < > 4.6  --  4.5 4.3 4.0 3.9  CL 97* 98  --   --   --   --   --  103  --   --   --  104  --  106  --   CO2 25  --   --   --   --   --   --  21*  --   --   --  24  --  28  --   GLUCOSE 141* 140*  --   --   --   --   --  165*  --   --   --  157*  --  195*  --   BUN 24* 25*  --   --   --   --   --  24*  --   --   --  25*  --  34*  --   CREATININE 0.97 0.80  --  0.97  --   --   --  1.11  --   --   --  1.14  --  0.88  --   CALCIUM 8.1*  --   --   --   --   --   --  7.5*  --   --   --  8.0*  --  7.7*  --   MG  --   --   --   --   --  1.8  --  2.5*  --   --   --  2.9*  --  3.3*  --   PHOS  --   --   --   --   --   --   --   --   --   --  4.1 3.6  --  2.4*  --    < > = values in this interval not displayed.   GFR: Estimated Creatinine Clearance: 117.9 mL/min (by C-G formula based on SCr of 0.88  mg/dL). Recent Labs  Lab 04/05/2021 0426 04/26/2021 1111 04/05/2021 1655 04/17/2021 2151 04/26/21 0307 04/26/21 1405 04/27/21 0308  WBC 18.6* 7.8  --   --  17.7*  --  25.0*  LATICACIDVEN 1.6  --  4.0* 4.4* 4.9* 3.9*  --     Liver Function Tests: Recent Labs  Lab 04/16/2021 0426 04/26/21 0307 04/27/21 0308  AST 26 43* 36  ALT 18 23 23   ALKPHOS 56 26* 58  BILITOT 5.1*   5.3* 5.2* 2.9*  PROT 7.1 4.9* 5.2*  ALBUMIN 2.5* 1.7* 1.7*   No results for input(s): LIPASE, AMYLASE in the last 168 hours. No results for input(s): AMMONIA in the last 168 hours.  ABG    Component Value Date/Time   PHART 7.405 04/27/2021 0317   PCO2ART 49.8 (H) 04/27/2021 0317   PO2ART 118 (H) 04/27/2021 0317   HCO3 31.2 (H) 04/27/2021 0317   TCO2 33 (H) 04/27/2021 0317   ACIDBASEDEF 1.0 04/26/2021 0916   O2SAT 99.0 04/27/2021 0317     Coagulation Profile: Recent Labs  Lab 04/20/2021 0426  INR 1.2    Cardiac Enzymes: No results for input(s): CKTOTAL, CKMB, CKMBINDEX, TROPONINI in the last 168 hours.  HbA1C: No results found for: HGBA1C  CBG: Recent Labs  Lab 04/26/21 0754 04/26/21 1143 04/26/21 1936 04/26/21 2326 04/27/21 0718  GLUCAP 172* 159* 186* 185* 190*   CRITICAL CARE Performed by: 04/29/21  Total critical care time: 60 minutes  Critical care time was exclusive of separately billable procedures and treating other patients. Critical care was necessary to treat or prevent imminent or life-threatening deterioration.  Critical care was time spent personally by me on the following activities: development of treatment plan with patient and/or surrogate as well as nursing, discussions with consultants, evaluation of patient's response to treatment, examination of patient, obtaining history from patient or surrogate, ordering and performing treatments and interventions, ordering and review of laboratory studies, ordering and review of radiographic studies, pulse oximetry and  re-evaluation of patient's condition.  Lanier Clam MSN, AGACNP-BC [facility] Pulmonary/Critical Care Medicine Amion for pager  04/27/2021, 12:05 PM

## 2021-04-27 NOTE — Plan of Care (Signed)

## 2021-04-28 ENCOUNTER — Inpatient Hospital Stay (HOSPITAL_COMMUNITY): Payer: Medicare Other

## 2021-04-28 DIAGNOSIS — R579 Shock, unspecified: Secondary | ICD-10-CM | POA: Diagnosis not present

## 2021-04-28 DIAGNOSIS — J9601 Acute respiratory failure with hypoxia: Secondary | ICD-10-CM | POA: Diagnosis not present

## 2021-04-28 DIAGNOSIS — Z9911 Dependence on respirator [ventilator] status: Secondary | ICD-10-CM | POA: Diagnosis not present

## 2021-04-28 DIAGNOSIS — A419 Sepsis, unspecified organism: Secondary | ICD-10-CM

## 2021-04-28 DIAGNOSIS — J80 Acute respiratory distress syndrome: Secondary | ICD-10-CM

## 2021-04-28 DIAGNOSIS — J189 Pneumonia, unspecified organism: Secondary | ICD-10-CM | POA: Diagnosis not present

## 2021-04-28 LAB — BASIC METABOLIC PANEL
Anion gap: 9 (ref 5–15)
BUN: 38 mg/dL — ABNORMAL HIGH (ref 6–20)
CO2: 32 mmol/L (ref 22–32)
Calcium: 7.7 mg/dL — ABNORMAL LOW (ref 8.9–10.3)
Chloride: 108 mmol/L (ref 98–111)
Creatinine, Ser: 0.71 mg/dL (ref 0.61–1.24)
GFR, Estimated: >60 mL/min (ref >60)
Glucose, Bld: 157 mg/dL — ABNORMAL HIGH (ref 70–99)
Potassium: 3.8 mmol/L (ref 3.5–5.1)
Sodium: 149 mmol/L — ABNORMAL HIGH (ref 135–145)

## 2021-04-28 LAB — CULTURE, RESPIRATORY W GRAM STAIN: Culture: NO GROWTH

## 2021-04-28 LAB — POCT I-STAT 7, (LYTES, BLD GAS, ICA,H+H)
Acid-Base Excess: 13 mmol/L — ABNORMAL HIGH (ref 0.0–2.0)
Bicarbonate: 37.9 mmol/L — ABNORMAL HIGH (ref 20.0–28.0)
Calcium, Ion: 1.12 mmol/L — ABNORMAL LOW (ref 1.15–1.40)
HCT: 24 % — ABNORMAL LOW (ref 39.0–52.0)
Hemoglobin: 8.2 g/dL — ABNORMAL LOW (ref 13.0–17.0)
O2 Saturation: 100 %
Potassium: 3.9 mmol/L (ref 3.5–5.1)
Sodium: 147 mmol/L — ABNORMAL HIGH (ref 135–145)
TCO2: 39 mmol/L — ABNORMAL HIGH (ref 22–32)
pCO2 arterial: 52.5 mmHg — ABNORMAL HIGH (ref 32.0–48.0)
pH, Arterial: 7.466 — ABNORMAL HIGH (ref 7.350–7.450)
pO2, Arterial: 201 mmHg — ABNORMAL HIGH (ref 83.0–108.0)

## 2021-04-28 LAB — MAGNESIUM: Magnesium: 3 mg/dL — ABNORMAL HIGH (ref 1.7–2.4)

## 2021-04-28 LAB — CBC
HCT: 24.1 % — ABNORMAL LOW (ref 39.0–52.0)
Hemoglobin: 8.1 g/dL — ABNORMAL LOW (ref 13.0–17.0)
MCH: 30.5 pg (ref 26.0–34.0)
MCHC: 33.6 g/dL (ref 30.0–36.0)
MCV: 90.6 fL (ref 80.0–100.0)
Platelets: 310 10*3/uL (ref 150–400)
RBC: 2.66 MIL/uL — ABNORMAL LOW (ref 4.22–5.81)
RDW: 13 % (ref 11.5–15.5)
WBC: 26.6 10*3/uL — ABNORMAL HIGH (ref 4.0–10.5)
nRBC: 0.8 % — ABNORMAL HIGH (ref 0.0–0.2)

## 2021-04-28 LAB — COOXEMETRY PANEL
Carboxyhemoglobin: 1.4 % (ref 0.5–1.5)
Methemoglobin: 1.2 % (ref 0.0–1.5)
O2 Saturation: 78.4 %
Total hemoglobin: 7.8 g/dL — ABNORMAL LOW (ref 12.0–16.0)

## 2021-04-28 LAB — LACTIC ACID, PLASMA: Lactic Acid, Venous: 2.2 mmol/L (ref 0.5–1.9)

## 2021-04-28 LAB — GLUCOSE, CAPILLARY
Glucose-Capillary: 110 mg/dL — ABNORMAL HIGH (ref 70–99)
Glucose-Capillary: 113 mg/dL — ABNORMAL HIGH (ref 70–99)
Glucose-Capillary: 114 mg/dL — ABNORMAL HIGH (ref 70–99)
Glucose-Capillary: 119 mg/dL — ABNORMAL HIGH (ref 70–99)
Glucose-Capillary: 120 mg/dL — ABNORMAL HIGH (ref 70–99)
Glucose-Capillary: 142 mg/dL — ABNORMAL HIGH (ref 70–99)

## 2021-04-28 LAB — LEGIONELLA PNEUMOPHILA SEROGP 1 UR AG: L. pneumophila Serogp 1 Ur Ag: NEGATIVE

## 2021-04-28 MED ORDER — SODIUM CHLORIDE 0.9 % IV SOLN
500.0000 mg | INTRAVENOUS | Status: DC
  Administered 2021-04-28 – 2021-04-29 (×2): 500 mg via INTRAVENOUS
  Filled 2021-04-28 (×2): qty 5

## 2021-04-28 MED ORDER — CALCIUM GLUCONATE-NACL 1-0.675 GM/50ML-% IV SOLN
1.0000 g | Freq: Once | INTRAVENOUS | Status: AC
  Administered 2021-04-28: 10:00:00 1000 mg via INTRAVENOUS
  Filled 2021-04-28: qty 50

## 2021-04-28 MED ORDER — POTASSIUM & SODIUM PHOSPHATES 280-160-250 MG PO PACK
2.0000 | PACK | ORAL | Status: AC
  Administered 2021-04-28 (×2): 2
  Filled 2021-04-28 (×2): qty 2

## 2021-04-28 MED ORDER — FUROSEMIDE 10 MG/ML IJ SOLN
40.0000 mg | Freq: Once | INTRAMUSCULAR | Status: AC
  Administered 2021-04-28: 18:00:00 40 mg via INTRAVENOUS
  Filled 2021-04-28: qty 4

## 2021-04-28 MED ORDER — POTASSIUM CHLORIDE 20 MEQ PO PACK
20.0000 meq | PACK | Freq: Two times a day (BID) | ORAL | Status: DC
  Administered 2021-04-28 – 2021-05-01 (×6): 20 meq
  Filled 2021-04-28 (×6): qty 1

## 2021-04-28 MED ORDER — FREE WATER
200.0000 mL | Status: DC
  Administered 2021-04-28 – 2021-04-29 (×7): 200 mL

## 2021-04-28 MED ORDER — ACETAMINOPHEN 325 MG PO TABS
650.0000 mg | ORAL_TABLET | ORAL | Status: DC | PRN
  Administered 2021-05-06 – 2021-05-08 (×2): 650 mg via ORAL
  Filled 2021-04-28 (×3): qty 2

## 2021-04-28 MED ORDER — FUROSEMIDE 10 MG/ML IJ SOLN
40.0000 mg | Freq: Once | INTRAMUSCULAR | Status: AC
  Administered 2021-04-28: 10:00:00 40 mg via INTRAVENOUS
  Filled 2021-04-28: qty 4

## 2021-04-28 MED ORDER — ACETAMINOPHEN 160 MG/5ML PO SOLN
650.0000 mg | ORAL | Status: DC | PRN
  Administered 2021-04-29 – 2021-05-06 (×11): 650 mg
  Filled 2021-04-28 (×11): qty 20.3

## 2021-04-28 MED ORDER — ACETAMINOPHEN 650 MG RE SUPP: 650.0000 mg | RECTAL | Status: DC | PRN

## 2021-04-28 MED ORDER — SODIUM CHLORIDE 0.9 % IV SOLN
3.0000 g | Freq: Four times a day (QID) | INTRAVENOUS | Status: DC
  Administered 2021-04-28 – 2021-05-01 (×14): 3 g via INTRAVENOUS
  Filled 2021-04-28 (×15): qty 8

## 2021-04-28 NOTE — Progress Notes (Signed)
NAME:  Fahd Galea, MRN:  [id number], DOB:  1995/06/20, LOS: 3 ADMISSION DATE:  05/01/2021, CONSULTATION DATE:  04/01/2021 REFERRING MD:  Alison Murray, CHIEF COMPLAINT:  dyspnea   History of Present Illness:  25 y o M with history of schizoaffective disorder, prior suicide attempt who was BIBEMS from home for dyspnea which had worsened over 2-3d PTA. Nausea, several episodes of emesis today. Left sided pleuritic CP. EMS gave him a liter of fluid.  In ED he was found to have necrotizing pneumonia and was given 1L LR, vanc, ceftriaxone and placed on nasal cannula which was escalated to BiPAP which he failed requiring endotracheal intubation.   Pertinent  Medical History  Schizoaffective disorder, prior suicide attempt  Significant Hospital Events: Including procedures, antibiotic start and stop dates in addition to other pertinent events   12/25 admitted, ETT placed. -CT chest with extensive cavitary opacity c/f necrotizing PNA, adjacent air fluid levels in L pleural space. Started on veletri. Vec gtt. ECMO consult -- not indicated at time of consult. NE, neo, vaso, solucortef  12/26 off vec gtt. Off neo  12/27 weaning veletri to off Dc solucortef> echo w/ septal motion inferior hypokinesis but no LV WMA. RV function mildly reduced. 12/28 weaning peep/fio2, changing RASS goal to -3. Off pressors. Abx narrowed. Vanc/ceftriaxone and flagyl stopped. Started on Unasyn and azith resumed until U legionella back  Interim History / Subjective:  No issues overnight   Objective   Blood pressure 109/68, pulse 87, temperature (Abnormal) 97.5 F (36.4 C), resp. rate 20, height 5\' 7"  (1.702 m), weight 71.3 kg, SpO2 96 %.    Vent Mode: PRVC FiO2 (%):  [60 %] 60 % Set Rate:  [20 bmp-24 bmp] 20 bmp Vt Set:  [530 mL] 530 mL PEEP:  [12 cmH20-14 cmH20] 12 cmH20 Plateau Pressure:  [21 cmH20-32 cmH20] 26 cmH20   Intake/Output Summary (Last 24 hours) at 04/28/2021 0849 Last data filed at 04/28/2021  0730 Gross per 24 hour  Intake 3517.61 ml  Output 2745 ml  Net 772.61 ml   Filed Weights   04/26/21 0500 04/27/21 0500 04/28/21 0500  Weight: 72.8 kg 72.7 kg 71.3 kg    Examination: General: Critically ill 25 year old male patient who remains on full ventilatory support HEENT normocephalic atraumatic no jugular venous distention appreciated orally intubated OG tube in place Pulmonary: Clear to auscultation diminished more in the left base current plateau pressure 26 respiratory rate just decreased to 20 Pcxr personally reviewed: ETT good position, left [place] cvl distal SVC Bilateral airspace disease persists without significant change left lower lobe cavitary disease looks unchanged Cardiac: Regular rate and rhythm without murmur rub or gallop Abdomen soft not tender Extremities warm dry brisk capillary refill Neuro sedated on Versed and fentanyl infusion.  Purposeful with upper extremities GU clear yellow urine Via Foley catheter  Resolved Hospital Problem list   Septic shock resolved as of 12/28 Shock liver resolved as of 12/27  Assessment & Plan:    Acute hypoxic respiratory failure with ARDS due to necrotizing PNA (NOS) Plan Cont LTV ventilation  Cont wean PEEP/FIO2 RASS goal changed to -3 Am cxr Abx day 5, currently ceftriaxone, flagyl and vanc. Narrow to Unasyn. 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  Plan Cont tele  IV lasix Ck Co-ox  Will need repeat echo prior to dc; may need cards eval pending repeat echo   AKI due to septic shock - improving Plan Renal dose meds Strict I&O Am chem   Fluid and electrolyte imbalance: Mild Hypernatremia, hypocalcemia   Plan Free water replacement Am chem Calcium gluconate   Lactic acidosis -started to come down 12/26  Plan Repeat LA today   Anemia of critical illness Plan Trend cbc  Hyperglycemia -critical illness, steroids, enteral nutrition Plan Cont ssi   Schizoaffective disorder Plan Cont Seroquel and prozac    Best Practice (right click and "Reselect all SmartList Selections" daily)   Diet/type: tubefeeds DVT prophylaxis: LMWH GI prophylaxis: Protonix Lines: Yes needed Foley: Yes needed Code Status:  full code Last date of multidisciplinary goals of care discussion [12/25: Patient mother was updated, decision was to continue full scope of care while keeping him full code   CRITICAL CARE Remains critically ill requiring titration of mechanical ventilation, complicated evaluation of metabolic derangements, and ongoing titration of acute medical therapies My critical care time 40 minutes  Simonne Martinet ACNP-BC [facility] Pulmonary/Critical Care Pager # [phone] OR # [phone] if no answer

## 2021-04-29 ENCOUNTER — Inpatient Hospital Stay (HOSPITAL_COMMUNITY): Payer: Medicare Other

## 2021-04-29 DIAGNOSIS — R6521 Severe sepsis with septic shock: Secondary | ICD-10-CM | POA: Diagnosis not present

## 2021-04-29 DIAGNOSIS — A419 Sepsis, unspecified organism: Secondary | ICD-10-CM | POA: Diagnosis not present

## 2021-04-29 DIAGNOSIS — Z9911 Dependence on respirator [ventilator] status: Secondary | ICD-10-CM | POA: Diagnosis not present

## 2021-04-29 DIAGNOSIS — J9601 Acute respiratory failure with hypoxia: Secondary | ICD-10-CM | POA: Diagnosis not present

## 2021-04-29 LAB — GLUCOSE, CAPILLARY
Glucose-Capillary: 127 mg/dL — ABNORMAL HIGH (ref 70–99)
Glucose-Capillary: 103 mg/dL — ABNORMAL HIGH (ref 70–99)
Glucose-Capillary: 116 mg/dL — ABNORMAL HIGH (ref 70–99)
Glucose-Capillary: 134 mg/dL — ABNORMAL HIGH (ref 70–99)
Glucose-Capillary: 123 mg/dL — ABNORMAL HIGH (ref 70–99)
Glucose-Capillary: 116 mg/dL — ABNORMAL HIGH (ref 70–99)

## 2021-04-29 LAB — BASIC METABOLIC PANEL
Anion gap: 6 (ref 5–15)
BUN: 32 mg/dL — ABNORMAL HIGH (ref 6–20)
CO2: 37 mmol/L — ABNORMAL HIGH (ref 22–32)
Calcium: 8 mg/dL — ABNORMAL LOW (ref 8.9–10.3)
Chloride: 110 mmol/L (ref 98–111)
Creatinine, Ser: 0.71 mg/dL (ref 0.61–1.24)
GFR, Estimated: >60 mL/min (ref >60)
Glucose, Bld: 128 mg/dL — ABNORMAL HIGH (ref 70–99)
Potassium: 4.1 mmol/L (ref 3.5–5.1)
Sodium: 153 mmol/L — ABNORMAL HIGH (ref 135–145)

## 2021-04-29 LAB — CBC
HCT: 24.6 % — ABNORMAL LOW (ref 39.0–52.0)
Hemoglobin: 8 g/dL — ABNORMAL LOW (ref 13.0–17.0)
MCH: 30.7 pg (ref 26.0–34.0)
MCHC: 32.5 g/dL (ref 30.0–36.0)
MCV: 94.3 fL (ref 80.0–100.0)
Platelets: 271 10*3/uL (ref 150–400)
RBC: 2.61 MIL/uL — ABNORMAL LOW (ref 4.22–5.81)
RDW: 13.2 % (ref 11.5–15.5)
WBC: 31.9 10*3/uL — ABNORMAL HIGH (ref 4.0–10.5)
nRBC: 0.7 % — ABNORMAL HIGH (ref 0.0–0.2)

## 2021-04-29 LAB — POCT I-STAT 7, (LYTES, BLD GAS, ICA,H+H)
Acid-Base Excess: 14 mmol/L — ABNORMAL HIGH (ref 0.0–2.0)
Bicarbonate: 42.6 mmol/L — ABNORMAL HIGH (ref 20.0–28.0)
Calcium, Ion: 1.13 mmol/L — ABNORMAL LOW (ref 1.15–1.40)
HCT: 24 % — ABNORMAL LOW (ref 39.0–52.0)
Hemoglobin: 8.2 g/dL — ABNORMAL LOW (ref 13.0–17.0)
O2 Saturation: 96 %
Patient temperature: 101.2
Potassium: 4 mmol/L (ref 3.5–5.1)
Sodium: 152 mmol/L — ABNORMAL HIGH (ref 135–145)
TCO2: 45 mmol/L — ABNORMAL HIGH (ref 22–32)
pCO2 arterial: 90.7 mmHg (ref 32.0–48.0)
pH, Arterial: 7.288 — ABNORMAL LOW (ref 7.350–7.450)
pO2, Arterial: 104 mmHg (ref 83.0–108.0)

## 2021-04-29 MED ORDER — EMPTY CONTAINERS FLEXIBLE MISC
0.8000 ug/kg/min | Status: AC
  Administered 2021-04-29: 20:00:00 1 ug/kg/min via INTRAVENOUS
  Administered 2021-04-30: 09:00:00 0.8 ug/kg/min via INTRAVENOUS
  Administered 2021-05-01: 1.1 ug/kg/min via INTRAVENOUS
  Administered 2021-05-01 – 2021-05-03 (×3): 1.2 ug/kg/min via INTRAVENOUS
  Filled 2021-04-29 (×6): qty 100

## 2021-04-29 MED ORDER — NOREPINEPHRINE 4 MG/250ML-% IV SOLN
INTRAVENOUS | Status: AC
  Filled 2021-04-29: qty 250

## 2021-04-29 MED ORDER — SODIUM CHLORIDE 3 % IN NEBU
4.0000 mL | INHALATION_SOLUTION | RESPIRATORY_TRACT | Status: DC
  Administered 2021-04-29 – 2021-05-01 (×11): 4 mL via RESPIRATORY_TRACT
  Filled 2021-04-29 (×11): qty 4

## 2021-04-29 MED ORDER — PROPOFOL 1000 MG/100ML IV EMUL
INTRAVENOUS | Status: AC
  Filled 2021-04-29: qty 100

## 2021-04-29 MED ORDER — VECURONIUM BROMIDE 10 MG IV SOLR
0.1000 mg/kg | INTRAVENOUS | Status: DC | PRN
  Administered 2021-04-29 – 2021-05-02 (×5): 7.2 mg via INTRAVENOUS
  Filled 2021-04-29 (×2): qty 10

## 2021-04-29 MED ORDER — FUROSEMIDE 10 MG/ML IJ SOLN
40.0000 mg | Freq: Three times a day (TID) | INTRAMUSCULAR | Status: AC
  Administered 2021-04-29 (×2): 40 mg via INTRAVENOUS
  Filled 2021-04-29 (×2): qty 4

## 2021-04-29 MED ORDER — SODIUM CHLORIDE 3 % IN NEBU
4.0000 mL | INHALATION_SOLUTION | RESPIRATORY_TRACT | Status: DC
  Administered 2021-04-29: 16:00:00 4 mL via RESPIRATORY_TRACT
  Filled 2021-04-29: qty 4

## 2021-04-29 MED ORDER — NOREPINEPHRINE 16 MG/250ML-% IV SOLN
0.0000 ug/min | INTRAVENOUS | Status: DC
  Administered 2021-04-29: 23:00:00 15 ug/min via INTRAVENOUS
  Administered 2021-04-30: 18:00:00 5 ug/min via INTRAVENOUS
  Administered 2021-05-02: 2 ug/min via INTRAVENOUS
  Filled 2021-04-29 (×2): qty 250

## 2021-04-29 MED ORDER — NOREPINEPHRINE 4 MG/250ML-% IV SOLN
0.0000 ug/min | INTRAVENOUS | Status: DC
  Administered 2021-04-29: 18:00:00 8 ug/min via INTRAVENOUS
  Administered 2021-04-29: 11:00:00 10 ug/min via INTRAVENOUS
  Filled 2021-04-29 (×3): qty 250

## 2021-04-29 MED ORDER — ARTIFICIAL TEARS OPHTHALMIC OINT
1.0000 "application " | TOPICAL_OINTMENT | Freq: Three times a day (TID) | OPHTHALMIC | Status: DC
  Administered 2021-04-29 – 2021-05-08 (×27): 1 via OPHTHALMIC
  Filled 2021-04-29 (×4): qty 3.5

## 2021-04-29 MED ORDER — DEXTROSE 5 % IV SOLN: INTRAVENOUS | Status: AC

## 2021-04-29 MED ORDER — VECURONIUM BROMIDE 10 MG IV SOLR: 0.8000 ug/kg/min | Status: DC

## 2021-04-29 MED ORDER — PROPOFOL 1000 MG/100ML IV EMUL
5.0000 ug/kg/min | INTRAVENOUS | Status: DC
  Administered 2021-04-29: 15:00:00 5 ug/kg/min via INTRAVENOUS
  Administered 2021-04-30: 15:00:00 8 ug/kg/min via INTRAVENOUS
  Administered 2021-04-30: 01:00:00 5 ug/kg/min via INTRAVENOUS
  Administered 2021-05-01: 15 ug/kg/min via INTRAVENOUS
  Administered 2021-05-01: 10 ug/kg/min via INTRAVENOUS
  Administered 2021-05-01: 50 ug/kg/min via INTRAVENOUS
  Administered 2021-05-02: 40 ug/kg/min via INTRAVENOUS
  Administered 2021-05-02 (×2): 30 ug/kg/min via INTRAVENOUS
  Administered 2021-05-02 – 2021-05-03 (×5): 35 ug/kg/min via INTRAVENOUS
  Administered 2021-05-04: 40 ug/kg/min via INTRAVENOUS
  Administered 2021-05-04: 35 ug/kg/min via INTRAVENOUS
  Administered 2021-05-04: 40 ug/kg/min via INTRAVENOUS
  Administered 2021-05-05: 50 ug/kg/min via INTRAVENOUS
  Administered 2021-05-05: 40 ug/kg/min via INTRAVENOUS
  Administered 2021-05-05: 50 ug/kg/min via INTRAVENOUS
  Administered 2021-05-05: 45 ug/kg/min via INTRAVENOUS
  Administered 2021-05-05: 30 ug/kg/min via INTRAVENOUS
  Administered 2021-05-06: 50 ug/kg/min via INTRAVENOUS
  Administered 2021-05-06: 45 ug/kg/min via INTRAVENOUS
  Administered 2021-05-06 (×2): 50 ug/kg/min via INTRAVENOUS
  Administered 2021-05-06: 30 ug/kg/min via INTRAVENOUS
  Administered 2021-05-07 – 2021-05-09 (×15): 50 ug/kg/min via INTRAVENOUS
  Filled 2021-04-29 (×6): qty 100
  Filled 2021-04-29: qty 200
  Filled 2021-04-29 (×4): qty 100
  Filled 2021-04-29: qty 200
  Filled 2021-04-29 (×13): qty 100
  Filled 2021-04-29: qty 200
  Filled 2021-04-29 (×10): qty 100
  Filled 2021-04-29: qty 200
  Filled 2021-04-29: qty 100

## 2021-04-29 MED ORDER — FREE WATER
200.0000 mL | Status: DC
  Administered 2021-04-29 – 2021-05-01 (×25): 200 mL

## 2021-04-29 NOTE — Progress Notes (Signed)
Pt 2 hours post starting Vecuronium drip. ABG drawn and critical results called over to [facility] MD.

## 2021-04-29 NOTE — Progress Notes (Signed)
NAME:  Mathew Cooke, MRN:  [id number], DOB:  1995/08/13, LOS: 4 ADMISSION DATE:  May 19, 2021, CONSULTATION DATE:  19-May-2021 REFERRING MD:  Alison Murray, CHIEF COMPLAINT:  dyspnea   History of Present Illness:  25 y o M with history of schizoaffective disorder, prior suicide attempt who was BIBEMS from home for dyspnea which had worsened over 2-3d PTA. Nausea, several episodes of emesis today. Left sided pleuritic CP. EMS gave him a liter of fluid.  In ED he was found to have necrotizing pneumonia and was given 1L LR, vanc, ceftriaxone and placed on nasal cannula which was escalated to BiPAP which he failed requiring endotracheal intubation.   Pertinent  Medical History  Schizoaffective disorder, prior suicide attempt  Significant Hospital Events: Including procedures, antibiotic start and stop dates in addition to other pertinent events   12/25 admitted, ETT placed. -CT chest with extensive cavitary opacity c/f necrotizing PNA, adjacent air fluid levels in L pleural space. Started on veletri. Vec gtt. ECMO consult -- not indicated at time of consult. NE, neo, vaso, solucortef  12/26 off vec gtt. Off neo  12/27 weaning veletri to off Dc solucortef> echo w/ septal motion inferior hypokinesis but no LV WMA. RV function mildly reduced. 12/28 weaning peep/fio2, changing RASS goal to -3. Off pressors. Abx narrowed. Vanc/ceftriaxone and flagyl stopped. Started on Unasyn and azith resumed until U legionella back  Interim History / Subjective:  Required increased sedation when it was weaned yesterday due to increasing vent requirements. Lots of sputum yesterday and today. Back on low dose NE.  Objective   Blood pressure 124/67, pulse (!) 113, temperature (!) 100.9 F (38.3 C), resp. rate 19, height 5\' 7"  (1.702 m), weight 71.6 kg, SpO2 92 %. CVP:  [10 mmHg-16 mmHg] 16 mmHg  Vent Mode: PRVC FiO2 (%):  [60 %-70 %] 70 % Set Rate:  [20 bmp] 20 bmp Vt Set:  [530 mL] 530 mL PEEP:  [12 cmH20-14 cmH20]  14 cmH20 Plateau Pressure:  [25 cmH20-29 cmH20] 27 cmH20   Intake/Output Summary (Last 24 hours) at 04/29/2021 1355 Last data filed at 04/29/2021 1300 Gross per 24 hour  Intake 2495.67 ml  Output 5050 ml  Net -2554.33 ml    Filed Weights   04/27/21 0500 04/28/21 0500 04/29/21 0434  Weight: 72.7 kg 71.3 kg 71.6 kg    Examination: General: Critically ill-appearing man intubated, sedated on mechanical ventilation HEENT: [place]/AT, eyes anicteric, endotracheal tube in place Pulmonary: Rhonchi bilaterally, thick tan secretions from endotracheal tube, very dyssynchronous with the vent Cardiac: S1-S2, tachycardic, regular rhythm Abdomen soft, nontender, nondistended Extremities normal muscle mass, minimal peripheral edema Neuro RASS -5, breathing above the vent, PERRL GU clear yellow urine  Na+ 153 Bicarb 37 BUN 32 Cr 0.71 WBC 31.9 H/H 8./24.6  CXR personally reviewed> ETT 7 cm above carina, persistent LL infiltrates, improved upper lobe infiltrates  Resolved Hospital Problem list   Septic shock resolved as of 12/28 Shock liver resolved as of 12/27  Assessment & Plan:   Acute hypoxic respiratory failure with ARDS due to necrotizing pneumonia, likely aspiration pneumonia -Low tidal volume ventilation, 4 to 8 cc/kg ideal body with goal plateau less than 30 driving pressure less than 15 - Required increased sedation for increased vent requirements over the last day.  Adding back NMB.  On propofol in addition to fentanyl and Versed. - Continue to titrate PEEP and FiO2 per ARDS ladder - Endotracheal tube advanced today.  A.m. chest x-ray - Continue broad antibiotics.  Repeat trach  aspirate -Continue Lasix for goal net negative fluid balance  -Will eventually need repeat CT to determine antibiotic suppression  Septic shock due to necrotizing PNA with multisystem organ dysfunction-AKI, shock liver, respiratory failure  -Continue antibiotics - Vasopressors as required to maintain MAP  greater than 65  Acute HFrEF, (EF 45-50%) likely septic CM  -Continue telemetry - Maintain optimized electrolytes -Follow-up echo next week; won't tolerate GDMT at this point with hypotension  AKI due to septic shock - improved -renally dose meds, avoid nephrotoxic meds -strict I/Os -Maintain adequate renal perfusion  Fluid and electrolyte imbalance: Mild Hypernatremia, hypocalcemia  -Increase free water-adding D5 and free water flushes - Continue to monitor electrolytes  Lactic acidosis -started to come down 12/26  -Continue antibiotics  Anemia of critical illness -transfuse for Hb<7 or hemodynamically significant bleeding -Monitor  Hyperglycemia due to critical illness, steroids, enteral nutrition -Sliding scale insulin as needed - Goal BG 140-180  Schizoaffective disorder -Continue Prozac and Seroquel  Girlfriend updated at bedside today during rounds.   Best Practice (right click and "Reselect all SmartList Selections" daily)   Diet/type: tubefeeds DVT prophylaxis: LMWH GI prophylaxis: Protonix Lines: Yes needed Foley: Yes needed Code Status:  full code Last date of multidisciplinary goals of care discussion [12/25: Patient mother was updated, decision was to continue full scope of care while keeping him full code    This patient is critically ill with multiple organ system failure which requires frequent high complexity decision making, assessment, support, evaluation, and titration of therapies. This was completed through the application of advanced monitoring technologies and extensive interpretation of multiple databases. During this encounter critical care time was devoted to patient care services described in this note for 40 minutes.  Steffanie Dunn, DO 04/29/21 7:53 PM [place] Pulmonary & Critical Care

## 2021-04-29 NOTE — Progress Notes (Signed)
ETT advanced 4 cm per MD order. ETT now placed 26@ lip.

## 2021-04-29 NOTE — Progress Notes (Signed)
Vent circuit changed from heated to dry circuit. No complications noted. Pt tolerated well.

## 2021-04-30 ENCOUNTER — Inpatient Hospital Stay (HOSPITAL_COMMUNITY): Payer: Medicare Other

## 2021-04-30 DIAGNOSIS — J9601 Acute respiratory failure with hypoxia: Secondary | ICD-10-CM | POA: Diagnosis not present

## 2021-04-30 DIAGNOSIS — J80 Acute respiratory distress syndrome: Secondary | ICD-10-CM | POA: Diagnosis not present

## 2021-04-30 DIAGNOSIS — Z9911 Dependence on respirator [ventilator] status: Secondary | ICD-10-CM | POA: Diagnosis not present

## 2021-04-30 LAB — CBC
HCT: 26.4 % — ABNORMAL LOW (ref 39.0–52.0)
Hemoglobin: 8.1 g/dL — ABNORMAL LOW (ref 13.0–17.0)
MCH: 30.3 pg (ref 26.0–34.0)
MCHC: 30.7 g/dL (ref 30.0–36.0)
MCV: 98.9 fL (ref 80.0–100.0)
Platelets: 255 10*3/uL (ref 150–400)
RBC: 2.67 MIL/uL — ABNORMAL LOW (ref 4.22–5.81)
RDW: 14 % (ref 11.5–15.5)
WBC: 28.2 10*3/uL — ABNORMAL HIGH (ref 4.0–10.5)
nRBC: 0.5 % — ABNORMAL HIGH (ref 0.0–0.2)

## 2021-04-30 LAB — CULTURE, BLOOD (ROUTINE X 2)
Culture: NO GROWTH
Culture: NO GROWTH
Special Requests: ADEQUATE
Special Requests: ADEQUATE

## 2021-04-30 LAB — POCT I-STAT 7, (LYTES, BLD GAS, ICA,H+H)
Acid-Base Excess: 14 mmol/L — ABNORMAL HIGH (ref 0.0–2.0)
Acid-Base Excess: 15 mmol/L — ABNORMAL HIGH (ref 0.0–2.0)
Bicarbonate: 43.3 mmol/L — ABNORMAL HIGH (ref 20.0–28.0)
Bicarbonate: 40 mmol/L — ABNORMAL HIGH (ref 20.0–28.0)
Calcium, Ion: 1.12 mmol/L — ABNORMAL LOW (ref 1.15–1.40)
Calcium, Ion: 1.15 mmol/L (ref 1.15–1.40)
HCT: 25 % — ABNORMAL LOW (ref 39.0–52.0)
HCT: 25 % — ABNORMAL LOW (ref 39.0–52.0)
Hemoglobin: 8.5 g/dL — ABNORMAL LOW (ref 13.0–17.0)
Hemoglobin: 8.5 g/dL — ABNORMAL LOW (ref 13.0–17.0)
O2 Saturation: 97 %
O2 Saturation: 90 %
Patient temperature: 100.1
Potassium: 4.4 mmol/L (ref 3.5–5.1)
Potassium: 3.7 mmol/L (ref 3.5–5.1)
Sodium: 152 mmol/L — ABNORMAL HIGH (ref 135–145)
Sodium: 147 mmol/L — ABNORMAL HIGH (ref 135–145)
TCO2: 46 mmol/L — ABNORMAL HIGH (ref 22–32)
TCO2: 42 mmol/L — ABNORMAL HIGH (ref 22–32)
pCO2 arterial: 93.5 mmHg (ref 32.0–48.0)
pCO2 arterial: 51.9 mmHg — ABNORMAL HIGH (ref 32.0–48.0)
pH, Arterial: 7.278 — ABNORMAL LOW (ref 7.350–7.450)
pH, Arterial: 7.495 — ABNORMAL HIGH (ref 7.350–7.450)
pO2, Arterial: 112 mmHg — ABNORMAL HIGH (ref 83.0–108.0)
pO2, Arterial: 56 mmHg — ABNORMAL LOW (ref 83.0–108.0)

## 2021-04-30 LAB — BASIC METABOLIC PANEL
Anion gap: 7 (ref 5–15)
BUN: 20 mg/dL (ref 6–20)
CO2: 38 mmol/L — ABNORMAL HIGH (ref 22–32)
Calcium: 7.7 mg/dL — ABNORMAL LOW (ref 8.9–10.3)
Chloride: 106 mmol/L (ref 98–111)
Creatinine, Ser: 0.68 mg/dL (ref 0.61–1.24)
GFR, Estimated: >60 mL/min (ref >60)
Glucose, Bld: 149 mg/dL — ABNORMAL HIGH (ref 70–99)
Potassium: 4.4 mmol/L (ref 3.5–5.1)
Sodium: 151 mmol/L — ABNORMAL HIGH (ref 135–145)

## 2021-04-30 LAB — TRIGLYCERIDES: Triglycerides: 168 mg/dL — ABNORMAL HIGH (ref <150)

## 2021-04-30 LAB — GLUCOSE, CAPILLARY
Glucose-Capillary: 108 mg/dL — ABNORMAL HIGH (ref 70–99)
Glucose-Capillary: 116 mg/dL — ABNORMAL HIGH (ref 70–99)
Glucose-Capillary: 132 mg/dL — ABNORMAL HIGH (ref 70–99)
Glucose-Capillary: 137 mg/dL — ABNORMAL HIGH (ref 70–99)
Glucose-Capillary: 152 mg/dL — ABNORMAL HIGH (ref 70–99)
Glucose-Capillary: 155 mg/dL — ABNORMAL HIGH (ref 70–99)
Glucose-Capillary: 97 mg/dL (ref 70–99)

## 2021-04-30 MED ORDER — FUROSEMIDE 10 MG/ML IJ SOLN
40.0000 mg | Freq: Once | INTRAMUSCULAR | Status: AC
  Administered 2021-04-30: 11:00:00 40 mg via INTRAVENOUS
  Filled 2021-04-30: qty 4

## 2021-04-30 MED ORDER — CALCIUM GLUCONATE-NACL 2-0.675 GM/100ML-% IV SOLN
2.0000 g | Freq: Once | INTRAVENOUS | Status: AC
  Administered 2021-04-30: 10:00:00 2000 mg via INTRAVENOUS
  Filled 2021-04-30: qty 100

## 2021-04-30 MED ORDER — IOHEXOL 350 MG/ML SOLN
75.0000 mL | Freq: Once | INTRAVENOUS | Status: AC | PRN
  Administered 2021-04-30: 17:00:00 75 mL via INTRAVENOUS

## 2021-04-30 MED ORDER — LINEZOLID 600 MG/300ML IV SOLN
600.0000 mg | Freq: Two times a day (BID) | INTRAVENOUS | Status: DC
  Administered 2021-04-30 – 2021-05-07 (×15): 600 mg via INTRAVENOUS
  Filled 2021-04-30 (×15): qty 300

## 2021-04-30 MED ORDER — DEXTROSE 5 % IV SOLN: INTRAVENOUS | Status: DC

## 2021-04-30 MED ORDER — ENOXAPARIN SODIUM 40 MG/0.4ML IJ SOSY
40.0000 mg | PREFILLED_SYRINGE | INTRAMUSCULAR | Status: DC
  Administered 2021-04-30 – 2021-05-09 (×10): 40 mg via SUBCUTANEOUS
  Filled 2021-04-30 (×10): qty 0.4

## 2021-04-30 NOTE — Procedures (Signed)
Cortrak  Person Inserting Tube:  Clovis Riley, Keiran Sias L, RD Tube Type:  Cortrak - 43 inches Tube Size:  10 Tube Location:  Right nare Initial Placement:  Stomach Secured by: Bridle Technique Used to Measure Tube Placement:  Marking at nare/corner of mouth Cortrak Secured At:  75 cm  Cortrak Tube Team Note:  Consult received to place a Cortrak feeding tube.   X-ray is required, abdominal x-ray has been ordered by the Cortrak team. Please confirm tube placement before using the Cortrak tube.   If the tube becomes dislodged please keep the tube and contact the Cortrak team at [email or web address] ([email or web address]) for replacement.  If after hours and replacement cannot be delayed, place a NG tube and confirm placement with an abdominal x-ray.    Karie Mainland, RD, LDN Clinical Dietitian See [facility] for contact information.

## 2021-04-30 NOTE — Progress Notes (Signed)
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 Not convinced that a chest tube will improve his outcome and may cause further lung damage to the nearby friable lung. With such dense disease in the lower lobes, I am not convinced that proning will improve his oxygenation and may worsen it. P:F 93. Will hold off on proning tonight, con't vecuronium and heavy sedation.  Steffanie Dunn, DO 04/30/21 6:45 PM [place] [facility]

## 2021-04-30 NOTE — Progress Notes (Signed)
RT transported patient from 3M10 to CT and back with RN. No complications. RT will continue to monitor.

## 2021-04-30 NOTE — Progress Notes (Signed)
NAME:  Mathew Cooke, MRN:  [id number], DOB:  [birth year], LOS: 5 ADMISSION DATE:  04/13/2021, CONSULTATION DATE:  04/09/2021 REFERRING MD:  Alison Murray, CHIEF COMPLAINT:  dyspnea   History of Present Illness:  25 y o M with history of schizoaffective disorder, prior suicide attempt who was BIBEMS from home for dyspnea which had worsened over 2-3d PTA. Nausea, several episodes of emesis today. Left sided pleuritic CP. EMS gave him a liter of fluid.  In ED he was found to have necrotizing pneumonia and was given 1L LR, vanc, ceftriaxone and placed on nasal cannula which was escalated to BiPAP which he failed requiring endotracheal intubation.   Pertinent  Medical History  Schizoaffective disorder, prior suicide attempt  Significant Hospital Events: Including procedures, antibiotic start and stop dates in addition to other pertinent events   12/25 admitted, ETT placed. -CT chest with extensive cavitary opacity c/f necrotizing PNA, adjacent air fluid levels in L pleural space. Started on veletri. Vec gtt. ECMO consult -- not indicated at time of consult. NE, neo, vaso, solucortef  12/26 off vec gtt. Off neo  12/27 weaning veletri to off Dc solucortef> echo w/ septal motion inferior hypokinesis but no LV WMA. RV function mildly reduced. 12/28 weaning peep/fio2, changing RASS goal to -3. Off pressors. Abx narrowed. Vanc/ceftriaxone and flagyl stopped. Started on Unasyn and azith resumed until U legionella back  Interim History / Subjective:  Back on vecuronium drip yesterday afternoon. Tmax 101.8.  Objective   Blood pressure 127/66, pulse (!) 101, temperature 98.8 F (37.1 C), resp. rate (!) 24, height 5\' 7"  (1.702 m), weight 71.1 kg, SpO2 95 %. CVP:  [13 mmHg-16 mmHg] 13 mmHg  Vent Mode: PRVC FiO2 (%):  [70 %-100 %] 80 % Set Rate:  [20 bmp-24 bmp] 24 bmp Vt Set:  [530 mL] 530 mL PEEP:  [14 cmH20-16 cmH20] 16 cmH20 Plateau Pressure:  [23 cmH20-38 cmH20] 38 cmH20   Intake/Output Summary  (Last 24 hours) at 04/30/2021 1009 Last data filed at 04/30/2021 0758 Gross per 24 hour  Intake 7376.83 ml  Output 4975 ml  Net 2401.83 ml    Filed Weights   04/28/21 0500 04/29/21 0434 04/30/21 0500  Weight: 71.3 kg 71.6 kg 71.1 kg    Examination: General: critically ill appearing man lying in bed in NAD, intubated, sedated, under NMB HEENT: [place]/AT, eyes anicteric Pulmonary: rhonchi improved, no significant ETT secretions today. Synchronous with MV. Pplat 32> improved to 30 with change in vent settings. DP now 16. Cardiac: S1S2, minimally tachycardic, reg rhythm Abdomen soft, minimally distended today, hypoactive bowel sounds Extremities normal muscle mass, dependent edema Neuro RASS -5, pupils small but reactive bilaterally GU amber urine  7.3/94/112/43 Na+ 151 Bicarb 38 BUN 20 Cr 0.68 WBC 28.2 H/H 8.1/26.4 Trach aspirate 12/29> abundant WBC, NGTD  CXR personally reviewed> RUL infiltrate worse with airbronchograms. ETT about 4.5 cm above carina.  Resolved Hospital Problem list   Septic shock resolved as of 12/28 Shock liver resolved as of 12/27  Assessment & Plan:   Acute hypoxic respiratory failure with ARDS due to necrotizing pneumonia, likely aspiration pneumonia -LTVV, 4-8cc/kg IBW with goal Pplat<30 and DP<15.  DP not yet at goal, but limited by hypercapnia.  -RR increased, repeat ABG this afternoon -Con't heavy sedation and NMB due to refractory hypercapnia.  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 BUN trending up with diuresis despite ongoing hypervolemia. -renally dose medications, avoid nephrotoxic meds. Avoiding going back on vancomycin -strict I/Os -ensure adequate renal perfusion  Fluid and electrolyte imbalance: mild hypernatremia, hypocalcemia  -Con't FWF -adding D5w gtt today - Continue to monitor electrolytes daily  Lactic acidosis -started to come down 12/26  -con't antibiotics and pressors to maintain MAP >65  Anemia of critical illness -transfuse for Hb<7 or hemodynamically significant bleeding -monitor -maintain euvolemia  Hyperglycemia due to critical illness, steroids, enteral nutrition -con't levemir 5 units daily -SSI PRN - Goal BG 140-180  Schizoaffective disorder -Continue Prozac and Seroquel  No family at bedside today.   Best Practice (right click and "Reselect all SmartList Selections" daily)   Diet/type: tubefeeds- reduced to trickle until proned DVT prophylaxis: LMWH GI prophylaxis: Protonix Lines: Yes needed Foley: Yes needed Code Status:  full code Last date of multidisciplinary goals of care discussion [12/25: Patient mother was updated, decision was to continue full scope of care while keeping him full code    This patient is critically ill with multiple organ system failure which requires frequent high complexity decision making, assessment, support, evaluation, and titration of therapies. This was completed through the application of advanced monitoring technologies and extensive interpretation of multiple databases. During this encounter critical care time was devoted to patient care services described in this note for 42 minutes.  Steffanie Dunn, DO 04/30/21 11:25 AM [facility]

## 2021-04-30 NOTE — Progress Notes (Signed)
°  Transition of Care [facility]) Screening Note   Patient Details  Name: Mathew Cooke Date of Birth: Apr 16, 1996   Transition of Care [facility]) CM/SW Contact:    Tom-Johnson, Hershal Coria, RN Phone Number: 04/30/2021, 2:39 PM  Patient is from home, admitted with Acute Hypoxemic Respiratory Failure. Currently on Vent, Fentanyl and Versed. Transition of Care Department [facility]) has reviewed patient and no TOC needs have been identified at this time. We will continue to monitor patient advancement through interdisciplinary progression rounds. If new patient transition needs arise, please place a TOC consult.

## 2021-05-01 ENCOUNTER — Inpatient Hospital Stay (HOSPITAL_COMMUNITY): Payer: Medicare Other

## 2021-05-01 DIAGNOSIS — Z9911 Dependence on respirator [ventilator] status: Secondary | ICD-10-CM | POA: Diagnosis not present

## 2021-05-01 DIAGNOSIS — J9601 Acute respiratory failure with hypoxia: Secondary | ICD-10-CM | POA: Diagnosis not present

## 2021-05-01 LAB — BASIC METABOLIC PANEL
Anion gap: 8 (ref 5–15)
BUN: 17 mg/dL (ref 6–20)
CO2: 34 mmol/L — ABNORMAL HIGH (ref 22–32)
Calcium: 7.8 mg/dL — ABNORMAL LOW (ref 8.9–10.3)
Chloride: 100 mmol/L (ref 98–111)
Creatinine, Ser: 0.63 mg/dL (ref 0.61–1.24)
GFR, Estimated: >60 mL/min (ref >60)
Glucose, Bld: 105 mg/dL — ABNORMAL HIGH (ref 70–99)
Potassium: 3.5 mmol/L (ref 3.5–5.1)
Sodium: 142 mmol/L (ref 135–145)

## 2021-05-01 LAB — POCT I-STAT 7, (LYTES, BLD GAS, ICA,H+H)
Acid-Base Excess: 10 mmol/L — ABNORMAL HIGH (ref 0.0–2.0)
Acid-Base Excess: 11 mmol/L — ABNORMAL HIGH (ref 0.0–2.0)
Acid-Base Excess: 13 mmol/L — ABNORMAL HIGH (ref 0.0–2.0)
Acid-Base Excess: 15 mmol/L — ABNORMAL HIGH (ref 0.0–2.0)
Acid-Base Excess: 9 mmol/L — ABNORMAL HIGH (ref 0.0–2.0)
Bicarbonate: 36.4 mmol/L — ABNORMAL HIGH (ref 20.0–28.0)
Bicarbonate: 37.5 mmol/L — ABNORMAL HIGH (ref 20.0–28.0)
Bicarbonate: 37.8 mmol/L — ABNORMAL HIGH (ref 20.0–28.0)
Bicarbonate: 38.4 mmol/L — ABNORMAL HIGH (ref 20.0–28.0)
Bicarbonate: 39 mmol/L — ABNORMAL HIGH (ref 20.0–28.0)
Calcium, Ion: 1.14 mmol/L — ABNORMAL LOW (ref 1.15–1.40)
Calcium, Ion: 1.14 mmol/L — ABNORMAL LOW (ref 1.15–1.40)
Calcium, Ion: 1.15 mmol/L (ref 1.15–1.40)
Calcium, Ion: 1.17 mmol/L (ref 1.15–1.40)
Calcium, Ion: 1.19 mmol/L (ref 1.15–1.40)
HCT: 22 % — ABNORMAL LOW (ref 39.0–52.0)
HCT: 22 % — ABNORMAL LOW (ref 39.0–52.0)
HCT: 24 % — ABNORMAL LOW (ref 39.0–52.0)
HCT: 27 % — ABNORMAL LOW (ref 39.0–52.0)
HCT: 27 % — ABNORMAL LOW (ref 39.0–52.0)
Hemoglobin: 7.5 g/dL — ABNORMAL LOW (ref 13.0–17.0)
Hemoglobin: 7.5 g/dL — ABNORMAL LOW (ref 13.0–17.0)
Hemoglobin: 8.2 g/dL — ABNORMAL LOW (ref 13.0–17.0)
Hemoglobin: 9.2 g/dL — ABNORMAL LOW (ref 13.0–17.0)
Hemoglobin: 9.2 g/dL — ABNORMAL LOW (ref 13.0–17.0)
O2 Saturation: 83 %
O2 Saturation: 97 %
O2 Saturation: 97 %
O2 Saturation: 98 %
O2 Saturation: 99 %
Patient temperature: 98.9
Patient temperature: 99.1
Patient temperature: 99.2
Patient temperature: 99.3
Patient temperature: 99.4
Potassium: 3.5 mmol/L (ref 3.5–5.1)
Potassium: 3.5 mmol/L (ref 3.5–5.1)
Potassium: 3.8 mmol/L (ref 3.5–5.1)
Potassium: 4 mmol/L (ref 3.5–5.1)
Potassium: 4.3 mmol/L (ref 3.5–5.1)
Sodium: 140 mmol/L (ref 135–145)
Sodium: 140 mmol/L (ref 135–145)
Sodium: 141 mmol/L (ref 135–145)
Sodium: 143 mmol/L (ref 135–145)
Sodium: 144 mmol/L (ref 135–145)
TCO2: 38 mmol/L — ABNORMAL HIGH (ref 22–32)
TCO2: 39 mmol/L — ABNORMAL HIGH (ref 22–32)
TCO2: 40 mmol/L — ABNORMAL HIGH (ref 22–32)
TCO2: 40 mmol/L — ABNORMAL HIGH (ref 22–32)
TCO2: 41 mmol/L — ABNORMAL HIGH (ref 22–32)
pCO2 arterial: 46.7 mmHg (ref 32.0–48.0)
pCO2 arterial: 52.5 mmHg — ABNORMAL HIGH (ref 32.0–48.0)
pCO2 arterial: 57.6 mmHg — ABNORMAL HIGH (ref 32.0–48.0)
pCO2 arterial: 68.3 mmHg (ref 32.0–48.0)
pCO2 arterial: 87.6 mmHg (ref 32.0–48.0)
pH, Arterial: 7.251 — ABNORMAL LOW (ref 7.350–7.450)
pH, Arterial: 7.35 (ref 7.350–7.450)
pH, Arterial: 7.411 (ref 7.350–7.450)
pH, Arterial: 7.466 — ABNORMAL HIGH (ref 7.350–7.450)
pH, Arterial: 7.532 — ABNORMAL HIGH (ref 7.350–7.450)
pO2, Arterial: 104 mmHg (ref 83.0–108.0)
pO2, Arterial: 132 mmHg — ABNORMAL HIGH (ref 83.0–108.0)
pO2, Arterial: 59 mmHg — ABNORMAL LOW (ref 83.0–108.0)
pO2, Arterial: 93 mmHg (ref 83.0–108.0)
pO2, Arterial: 96 mmHg (ref 83.0–108.0)

## 2021-05-01 LAB — CBC
HCT: 22.4 % — ABNORMAL LOW (ref 39.0–52.0)
Hemoglobin: 7.4 g/dL — ABNORMAL LOW (ref 13.0–17.0)
MCH: 31.4 pg (ref 26.0–34.0)
MCHC: 33 g/dL (ref 30.0–36.0)
MCV: 94.9 fL (ref 80.0–100.0)
Platelets: 240 10*3/uL (ref 150–400)
RBC: 2.36 MIL/uL — ABNORMAL LOW (ref 4.22–5.81)
RDW: 13.9 % (ref 11.5–15.5)
WBC: 30.4 10*3/uL — ABNORMAL HIGH (ref 4.0–10.5)
nRBC: 0.4 % — ABNORMAL HIGH (ref 0.0–0.2)

## 2021-05-01 LAB — CULTURE, RESPIRATORY W GRAM STAIN: Culture: NORMAL

## 2021-05-01 LAB — MAGNESIUM: Magnesium: 2.1 mg/dL (ref 1.7–2.4)

## 2021-05-01 LAB — GLUCOSE, CAPILLARY
Glucose-Capillary: 103 mg/dL — ABNORMAL HIGH (ref 70–99)
Glucose-Capillary: 105 mg/dL — ABNORMAL HIGH (ref 70–99)
Glucose-Capillary: 121 mg/dL — ABNORMAL HIGH (ref 70–99)
Glucose-Capillary: 86 mg/dL (ref 70–99)
Glucose-Capillary: 90 mg/dL (ref 70–99)
Glucose-Capillary: 94 mg/dL (ref 70–99)

## 2021-05-01 LAB — PHOSPHORUS: Phosphorus: 3.5 mg/dL (ref 2.5–4.6)

## 2021-05-01 MED ORDER — POTASSIUM CHLORIDE 20 MEQ PO PACK
40.0000 meq | PACK | Freq: Two times a day (BID) | ORAL | Status: DC
  Administered 2021-05-01: 40 meq
  Filled 2021-05-01: qty 2

## 2021-05-01 MED ORDER — FUROSEMIDE 10 MG/ML IJ SOLN
40.0000 mg | Freq: Two times a day (BID) | INTRAMUSCULAR | Status: DC
  Administered 2021-05-01: 40 mg via INTRAVENOUS
  Filled 2021-05-01: qty 4

## 2021-05-01 MED ORDER — FREE WATER
250.0000 mL | Status: DC
Start: 2021-05-01 — End: 2021-05-03
  Administered 2021-05-02 – 2021-05-03 (×9): 250 mL

## 2021-05-01 MED ORDER — SODIUM CHLORIDE 0.9 % IV SOLN
200.0000 mg | Freq: Once | INTRAVENOUS | Status: AC
  Administered 2021-05-01: 200 mg via INTRAVENOUS
  Filled 2021-05-01: qty 200

## 2021-05-01 MED ORDER — HYDROMORPHONE BOLUS VIA INFUSION
0.2500 mg | INTRAVENOUS | Status: DC | PRN
  Administered 2021-05-01 – 2021-05-05 (×4): 2 mg via INTRAVENOUS
  Administered 2021-05-05: 1 mg via INTRAVENOUS
  Administered 2021-05-05 – 2021-05-07 (×12): 2 mg via INTRAVENOUS
  Filled 2021-05-01: qty 2

## 2021-05-01 MED ORDER — HYDROMORPHONE HCL 1 MG/ML IJ SOLN
1.0000 mg | Freq: Once | INTRAMUSCULAR | Status: AC
  Administered 2021-05-01: 1 mg via INTRAVENOUS

## 2021-05-01 MED ORDER — MEROPENEM 1 G IV SOLR
1.0000 g | Freq: Three times a day (TID) | INTRAVENOUS | Status: DC
  Administered 2021-05-01 – 2021-05-07 (×17): 1 g via INTRAVENOUS
  Filled 2021-05-01 (×19): qty 1

## 2021-05-01 MED ORDER — SODIUM CHLORIDE 0.9 % IV SOLN
100.0000 mg | INTRAVENOUS | Status: DC
  Administered 2021-05-02 – 2021-05-06 (×5): 100 mg via INTRAVENOUS
  Filled 2021-05-01 (×6): qty 100

## 2021-05-01 MED ORDER — SODIUM CHLORIDE 0.9 % IV SOLN
0.5000 mg/h | INTRAVENOUS | Status: DC
  Administered 2021-05-01: 2 mg/h via INTRAVENOUS
  Administered 2021-05-02 – 2021-05-06 (×7): 4 mg/h via INTRAVENOUS
  Administered 2021-05-06 – 2021-05-07 (×4): 8 mg/h via INTRAVENOUS
  Filled 2021-05-01 (×16): qty 5

## 2021-05-01 MED ORDER — OXYCODONE HCL 5 MG PO TABS
10.0000 mg | ORAL_TABLET | Freq: Four times a day (QID) | ORAL | Status: DC
  Administered 2021-05-01 – 2021-05-06 (×20): 10 mg
  Filled 2021-05-01 (×20): qty 2

## 2021-05-01 MED ORDER — STERILE WATER FOR INJECTION IJ SOLN
50.0000 ng/kg/min | INTRAVENOUS | Status: DC
  Administered 2021-05-01 – 2021-05-04 (×10): 50 ng/kg/min via RESPIRATORY_TRACT
  Administered 2021-05-04: 23:00:00 30 ng/kg/min via RESPIRATORY_TRACT
  Administered 2021-05-05 – 2021-05-09 (×14): 50 ng/kg/min via RESPIRATORY_TRACT
  Filled 2021-05-01 (×37): qty 5

## 2021-05-01 NOTE — Procedures (Signed)
Bronchoscopy Procedure Note  Zuri Bradway  [id number]  Jan 13, 1996  Date:05/01/21  Time:7:36 PM   Provider Performing:Javari Bufkin P Chestine Spore   Procedure(s):  Subsequent Therapeutic Aspiration of Tracheobronchial Tree [id number])  Indication(s) Refractory hypoxia  Consent Unable to obtain consent due to emergent nature of procedure.  Anesthesia Per MAR   Time Out Verified patient identification, verified procedure, site/side was marked, verified correct patient position, special equipment/implants available, medications/allergies/relevant history reviewed, required imaging and test results available.   Sterile Technique Usual hand hygiene, masks, gowns, and gloves were used   Procedure Description Bronchoscope advanced through endotracheal tube and into airway.  Airways were examined down to subsegmental level with findings noted below.   Following diagnostic evaluation, Therapeutic aspiration performed in lingula, RML, bilateral lower lobes with thick, yellow purulent sputum  Findings: thick yellow purulent sputum throughout much of the tracheobronchial tree bubbling up from lower airways    Complications/Tolerance None; patient tolerated the procedure well.- saturations remains stable compared to where they were prior to procedure in low 80s Chest X-ray is not needed post procedure.   EBL 0   Specimen(s) Bronchial secretions from RLL for routine resp  & fungal culture.  Steffanie Dunn, DO 05/01/21 7:38 PM [facility]

## 2021-05-01 NOTE — Progress Notes (Signed)
Inhaled epoprostenol (VELETRI) Monitoring:  Current dose:  50 ng/kg/min and 6.61 mL/hr IBW: 66.1 kg Duration of each syringe: 7.6 hr Last syringe hung (date/time): 05/01/21 2045  Next syringe due (date/time): 05/02/21 at 0330 Backup on unit: Yes  Current plan:   Reinitiating therapy    Sheppard Coil PharmD., BCPS Clinical Pharmacist 05/01/2021 7:39 PM

## 2021-05-01 NOTE — Progress Notes (Signed)
eLink Physician-Brief Progress Note Patient Name: Mathew Cooke DOB: 1995-10-28 MRN: [id number]   Date of Service  05/01/2021  HPI/Events of Note  ABG result reviewed.  eICU Interventions  Ventilator rate reduced from 28 to 24.        Thomasene Lot Miracle Criado 05/01/2021, 3:29 AM

## 2021-05-01 NOTE — Progress Notes (Signed)
RT assisted Dr.Chee Dimon with bronchoscopy. No complications.   RT taped ETT for proning with white cloth tape. ETT remains 25@lip .  RT obtained ABG before patient was proned and reported results to Dr.Mairin Lindsley @1902 .  ABG as follows: 7.25/87.6/59/38.4  Rtx2 and Rnx5 proned patient @1930  per MD order. Head was turned to patients left side. MD at bedside.

## 2021-05-01 NOTE — Progress Notes (Addendum)
Desaturation, not coming up out of 70s despite aggressive suctioning. Ppeak now ~40, Pplat ~35. No cuff leak. US exam-- symmetric sliding lung. CXR no pneumothorax, worse RLL infiltrate compared to 12/30 AM CXR. Decision made to bronch due to concern for distal occlusive secretions. Proning, adding inhaled EPO back. Changing fentanyl> dilaudid due to hypothetical risk of stiff chest wall. Repeat resp culture from bronch. Adding antifungal, broadening to anti-pseudomonal coverage.   Additional cc time: 45 min.  Steffanie Dunn, DO 05/01/21 7:40 PM [place] [facility]

## 2021-05-01 NOTE — Progress Notes (Addendum)
CXR discussed with [facility]iology, no pneumothorax.    Emergent FOB performed per Dr. Chestine Spore to assess airways.  Thick yellow/brown mucus returned largely from right.  ETT largely clear.     Plan: -send tracheal aspirate for bacterial and fungal  -stop TF overnight  -begin eraxis  -stop unasyn, add meropenem for pseudomonal coverage  -prone patient for 16 hours   Additional CC Time: 30 minutes    Canary Brim, MSN, APRN, NP-C, AGACNP-BC [facility] 05/01/2021, 7:17 PM   Please see [email or web address] for pager details.   From 7A-7P if no response, please call [phone] After hours, please call ELink [phone]

## 2021-05-01 NOTE — Progress Notes (Signed)
NAME:  Mathew Cooke, MRN:  [id number], DOB:  29-May-1995, LOS: 6 ADMISSION DATE:  04/09/2021, CONSULTATION DATE:  04/29/2021 REFERRING MD:  Alison Murray, CHIEF COMPLAINT:  dyspnea   History of Present Illness:  25 y o M with history of schizoaffective disorder, prior suicide attempt who was BIBEMS from home for dyspnea which had worsened over 2-3d PTA. Nausea, several episodes of emesis today. Left sided pleuritic CP. EMS gave him a liter of fluid.  In ED he was found to have necrotizing pneumonia and was given 1L LR, vanc, ceftriaxone and placed on nasal cannula which was escalated to BiPAP which he failed requiring endotracheal intubation.   Pertinent  Medical History  Schizoaffective disorder, prior suicide attempt  Significant Hospital Events: Including procedures, antibiotic start and stop dates in addition to other pertinent events   12/25 admitted, ETT placed. CT chest with extensive cavitary opacity c/f necrotizing PNA, adjacent air fluid levels in L pleural space. Started on veletri. Vec gtt. ECMO consult >not indicated at time of consult. NE, neo, vaso, solucortef  12/26 off vec gtt. Off neo  12/27 weaning veletri to off Dc solucortef> echo w/ septal motion inferior hypokinesis but no LV WMA. RV function mildly reduced. 12/28 weaning peep/fio2, changing RASS goal to -3. Off pressors. Abx narrowed. Vanc/ceftriaxone and flagyl stopped. Started on Unasyn and azith resumed until U legionella back.   12/29 Vecuronium infusion started, fever 101.8 12/31 Vent - 60%, PEEP 14, P/f 155, pPlat 28, DP 14  Interim History / Subjective:  Tmac 99.6 / WBC 30.4 Glucose trend 94-105 P/f 155, pPlat 28 RN reports large BM 12/30 at shift change   Objective   Blood pressure (!) 120/59, pulse (!) 102, temperature 99.6 F (37.6 C), resp. rate (!) 24, height 5\' 7"  (1.702 m), weight 72.1 kg, SpO2 91 %. CVP:  [12 mmHg] 12 mmHg  Vent Mode: PRVC FiO2 (%):  [60 %] 60 % Set Rate:  [24 bmp-30 bmp] 24  bmp Vt Set:  [530 mL] 530 mL PEEP:  [14 cmH20] 14 cmH20 Plateau Pressure:  [29 cmH20-33 cmH20] 29 cmH20   Intake/Output Summary (Last 24 hours) at 05/01/2021 1226 Last data filed at 05/01/2021 1014 Gross per 24 hour  Intake 6207.84 ml  Output 2181 ml  Net 4026.84 ml   Filed Weights   04/29/21 0434 04/30/21 0500 05/01/21 0500  Weight: 71.6 kg 71.1 kg 72.1 kg    Examination: General: young adult male lying in bed on vent in NAD, critically ill appearing  HEENT: MM pink/moist, ETT, pupils pinpoint, anicteric  Neuro: sedate, paralyzed  CV: s1s2 s1s2 RRR, ST on monitor, no m/r/g PULM: non-labored at rest on vent, lungs bilaterally with coarse rhonchi GI: soft, bsx4 active  Extremities: warm/dry, generalized dependent edema  Skin: no rashes or lesions, multiple tattoos  Resolved Hospital Problem list   Septic shock resolved as of 12/28 Shock liver resolved as of 12/27  Lactic Acidosis   Assessment & Plan:   Acute hypoxic respiratory failure with ARDS due to necrotizing pneumonia, likely aspiration pneumonia -PRVC with low Vt ventilation  -Pplat <30, DP ,15 goal  -wean PEEP / FiO2 per ARDS protocol  -Deep sedation with RASS Goal of -5 while on NMB due to ARDS -VAP prevention measures  -continue linezolid, unasyn > most recent tracheal aspiraate from 12/29 with abundant WBC, rare GPC (no staph) -goal CVP <5 -lasix 40 mg BID x3 doses with KCL for negative balance   Septic shock due to necrotizing PNA with  multisystem organ dysfunction (AKI, shock liver, respiratory failure) -abx as above -wean levophed for MAP >65.  On low dose, suspect sedation effect  -anticipate fever, leukocytosis related to necrotizing PNA, follow trend closely   Acute HFrEF, (EF 45-50%) likely septic CM  -tele monitoring  -follow I/O's  -monitor & replace electrolytes as indicated  -will need follow up ECHO to ensure recovery of EF   AKI due to septic shock   Improved. Mild hypervolemia on exam   -renal dose medications -Trend BMP / urinary output -Replace electrolytes as indicated -Avoid nephrotoxic agents, ensure adequate renal perfusion  Fluid and electrolyte imbalance: mild hypernatremia, hypocalcemia  -free water flushes  -D5W at 100 ml/hr  -lasix as above with KCL   Anemia of Critical Illness Baseline Hgb ~12-13 -evolemia goal  -trend CBC  -transfuse for Hgb <7% or active bleeding   Hyperglycemia  Due to critical illness, steroids, enteral nutrition -continue levemir 5 units QD -SSI, moderate scale  -glucose goal 140-180 while in ICU   Schizoaffective disorder -Prozac, seroquel PT   Best Practice (right click and "Reselect all SmartList Selections" daily)  Diet/type: tubefeeds DVT prophylaxis: LMWH GI prophylaxis: Protonix Lines: Yes needed Foley: Yes needed Code Status:  full code Last date of multidisciplinary goals of care discussion: 12/25 Patient mother was updated, decision was to continue full scope of care while keeping him full code.    Mother called 12/31 for update, no answer.  Message left for return call.   Critical Care Time: 40 minutes      Canary Brim, MSN, APRN, NP-C, AGACNP-BC [facility] 05/01/2021, 12:26 PM   Please see [email or web address] for pager details.   From 7A-7P if no response, please call [phone] After hours, please call ELink [phone]

## 2021-05-01 NOTE — Progress Notes (Signed)
Assessed patient at bedside for desaturation, increased peak airway pressures after turn to right side.  RT at bedside, suctioned patient with secretions returned but no change in airway pressures.  Patient hypertensive, tachycardic.BM noted.  Bilateral breath sounds unchanged.  [place] assessment by Dr. Chestine Spore with sliding lung on both sides.  Patient given 50 mcg bolus of fentanyl.  Temp 99.0 on active cooling by blanket.   Plan -assess STAT CXR to ensure no PTX -assess LE doppler (RV good on bedside ECHO) -change fentanyl to dilaudid  -add oxycodone PT     Canary Brim, MSN, APRN, NP-C, AGACNP-BC [facility] 05/01/2021, 6:49 PM   Please see [email or web address] for pager details.   From 7A-7P if no response, please call [phone] After hours, please call ELink [phone]

## 2021-05-02 ENCOUNTER — Inpatient Hospital Stay (HOSPITAL_COMMUNITY): Payer: Medicare Other

## 2021-05-02 DIAGNOSIS — Z9911 Dependence on respirator [ventilator] status: Secondary | ICD-10-CM | POA: Diagnosis not present

## 2021-05-02 DIAGNOSIS — J80 Acute respiratory distress syndrome: Secondary | ICD-10-CM | POA: Diagnosis not present

## 2021-05-02 DIAGNOSIS — R609 Edema, unspecified: Secondary | ICD-10-CM

## 2021-05-02 DIAGNOSIS — J9601 Acute respiratory failure with hypoxia: Secondary | ICD-10-CM | POA: Diagnosis not present

## 2021-05-02 DIAGNOSIS — A419 Sepsis, unspecified organism: Secondary | ICD-10-CM | POA: Diagnosis not present

## 2021-05-02 DIAGNOSIS — M7989 Other specified soft tissue disorders: Secondary | ICD-10-CM

## 2021-05-02 LAB — CBC
HCT: 24.6 % — ABNORMAL LOW (ref 39.0–52.0)
Hemoglobin: 8.3 g/dL — ABNORMAL LOW (ref 13.0–17.0)
MCH: 31.2 pg (ref 26.0–34.0)
MCHC: 33.7 g/dL (ref 30.0–36.0)
MCV: 92.5 fL (ref 80.0–100.0)
Platelets: 293 10*3/uL (ref 150–400)
RBC: 2.66 MIL/uL — ABNORMAL LOW (ref 4.22–5.81)
RDW: 13.9 % (ref 11.5–15.5)
WBC: 35.6 10*3/uL — ABNORMAL HIGH (ref 4.0–10.5)
nRBC: 0.4 % — ABNORMAL HIGH (ref 0.0–0.2)

## 2021-05-02 LAB — POCT I-STAT 7, (LYTES, BLD GAS, ICA,H+H)
Acid-Base Excess: 14 mmol/L — ABNORMAL HIGH (ref 0.0–2.0)
Acid-Base Excess: 12 mmol/L — ABNORMAL HIGH (ref 0.0–2.0)
Acid-Base Excess: 10 mmol/L — ABNORMAL HIGH (ref 0.0–2.0)
Acid-Base Excess: 13 mmol/L — ABNORMAL HIGH (ref 0.0–2.0)
Bicarbonate: 39.1 mmol/L — ABNORMAL HIGH (ref 20.0–28.0)
Bicarbonate: 38 mmol/L — ABNORMAL HIGH (ref 20.0–28.0)
Bicarbonate: 34.7 mmol/L — ABNORMAL HIGH (ref 20.0–28.0)
Bicarbonate: 38.8 mmol/L — ABNORMAL HIGH (ref 20.0–28.0)
Calcium, Ion: 1.1 mmol/L — ABNORMAL LOW (ref 1.15–1.40)
Calcium, Ion: 1.12 mmol/L — ABNORMAL LOW (ref 1.15–1.40)
Calcium, Ion: 0.99 mmol/L — ABNORMAL LOW (ref 1.15–1.40)
Calcium, Ion: 1.1 mmol/L — ABNORMAL LOW (ref 1.15–1.40)
HCT: 23 % — ABNORMAL LOW (ref 39.0–52.0)
HCT: 24 % — ABNORMAL LOW (ref 39.0–52.0)
HCT: 22 % — ABNORMAL LOW (ref 39.0–52.0)
HCT: 23 % — ABNORMAL LOW (ref 39.0–52.0)
Hemoglobin: 7.8 g/dL — ABNORMAL LOW (ref 13.0–17.0)
Hemoglobin: 8.2 g/dL — ABNORMAL LOW (ref 13.0–17.0)
Hemoglobin: 7.5 g/dL — ABNORMAL LOW (ref 13.0–17.0)
Hemoglobin: 7.8 g/dL — ABNORMAL LOW (ref 13.0–17.0)
O2 Saturation: 100 %
O2 Saturation: 92 %
O2 Saturation: 95 %
O2 Saturation: 99 %
Patient temperature: 99
Patient temperature: 99.9
Patient temperature: 101.4
Patient temperature: 97.5
Potassium: 4 mmol/L (ref 3.5–5.1)
Potassium: 4.2 mmol/L (ref 3.5–5.1)
Potassium: 3.7 mmol/L (ref 3.5–5.1)
Potassium: 4 mmol/L (ref 3.5–5.1)
Sodium: 141 mmol/L (ref 135–145)
Sodium: 141 mmol/L (ref 135–145)
Sodium: 143 mmol/L (ref 135–145)
Sodium: 140 mmol/L (ref 135–145)
TCO2: 41 mmol/L — ABNORMAL HIGH (ref 22–32)
TCO2: 40 mmol/L — ABNORMAL HIGH (ref 22–32)
TCO2: 36 mmol/L — ABNORMAL HIGH (ref 22–32)
TCO2: 41 mmol/L — ABNORMAL HIGH (ref 22–32)
pCO2 arterial: 55.4 mmHg — ABNORMAL HIGH (ref 32.0–48.0)
pCO2 arterial: 58.6 mmHg — ABNORMAL HIGH (ref 32.0–48.0)
pCO2 arterial: 52.1 mmHg — ABNORMAL HIGH (ref 32.0–48.0)
pCO2 arterial: 57.6 mmHg — ABNORMAL HIGH (ref 32.0–48.0)
pH, Arterial: 7.458 — ABNORMAL HIGH (ref 7.350–7.450)
pH, Arterial: 7.423 (ref 7.350–7.450)
pH, Arterial: 7.437 (ref 7.350–7.450)
pH, Arterial: 7.434 (ref 7.350–7.450)
pO2, Arterial: 213 mmHg — ABNORMAL HIGH (ref 83.0–108.0)
pO2, Arterial: 68 mmHg — ABNORMAL LOW (ref 83.0–108.0)
pO2, Arterial: 80 mmHg — ABNORMAL LOW (ref 83.0–108.0)
pO2, Arterial: 117 mmHg — ABNORMAL HIGH (ref 83.0–108.0)

## 2021-05-02 LAB — COMPREHENSIVE METABOLIC PANEL
ALT: 44 U/L (ref 0–44)
AST: 47 U/L — ABNORMAL HIGH (ref 15–41)
Albumin: <1.5 g/dL — ABNORMAL LOW (ref 3.5–5.0)
Alkaline Phosphatase: 228 U/L — ABNORMAL HIGH (ref 38–126)
Anion gap: 9 (ref 5–15)
BUN: 14 mg/dL (ref 6–20)
CO2: 34 mmol/L — ABNORMAL HIGH (ref 22–32)
Calcium: 8.2 mg/dL — ABNORMAL LOW (ref 8.9–10.3)
Chloride: 99 mmol/L (ref 98–111)
Creatinine, Ser: 0.6 mg/dL — ABNORMAL LOW (ref 0.61–1.24)
GFR, Estimated: >60 mL/min (ref >60)
Glucose, Bld: 95 mg/dL (ref 70–99)
Potassium: 4.1 mmol/L (ref 3.5–5.1)
Sodium: 142 mmol/L (ref 135–145)
Total Bilirubin: 3.4 mg/dL — ABNORMAL HIGH (ref 0.3–1.2)
Total Protein: 5.4 g/dL — ABNORMAL LOW (ref 6.5–8.1)

## 2021-05-02 LAB — GLUCOSE, CAPILLARY
Glucose-Capillary: 89 mg/dL (ref 70–99)
Glucose-Capillary: 97 mg/dL (ref 70–99)
Glucose-Capillary: 85 mg/dL (ref 70–99)
Glucose-Capillary: 100 mg/dL — ABNORMAL HIGH (ref 70–99)
Glucose-Capillary: 109 mg/dL — ABNORMAL HIGH (ref 70–99)
Glucose-Capillary: 185 mg/dL — ABNORMAL HIGH (ref 70–99)

## 2021-05-02 MED ORDER — SODIUM CHLORIDE 0.9 % IV SOLN
250.0000 mg | Freq: Four times a day (QID) | INTRAVENOUS | Status: AC
  Administered 2021-05-02 – 2021-05-05 (×12): 250 mg via INTRAVENOUS
  Filled 2021-05-02: qty 4
  Filled 2021-05-02: qty 2
  Filled 2021-05-02 (×5): qty 4
  Filled 2021-05-02: qty 2
  Filled 2021-05-02 (×4): qty 4

## 2021-05-02 MED ORDER — FUROSEMIDE 10 MG/ML IJ SOLN
40.0000 mg | Freq: Three times a day (TID) | INTRAMUSCULAR | Status: AC
  Administered 2021-05-02 (×3): 40 mg via INTRAVENOUS
  Filled 2021-05-02 (×3): qty 4

## 2021-05-02 MED ORDER — POTASSIUM CHLORIDE 20 MEQ PO PACK
20.0000 meq | PACK | Freq: Once | ORAL | Status: AC
  Administered 2021-05-02: 20 meq
  Filled 2021-05-02: qty 1

## 2021-05-02 NOTE — Progress Notes (Signed)
I updated the patient's father Alinda Money on his care.  Steffanie Dunn, DO 05/02/21 12:50 PM [facility]

## 2021-05-02 NOTE — Progress Notes (Signed)
ABG with P:F <150, reproned. Desaturated to 50s, did not recover. Not many secretions coming up with suctioning. Flipped back, some secretions suctioned out then. Saturations improved from 70>80s. CXR performed- prelim read no pneumothorax. Still has severe cavitary disease and extensive RLL infiltrate.  LE [place]: no DVTs.  Steffanie Dunn, DO 05/02/21 6:33 PM [facility]

## 2021-05-02 NOTE — Progress Notes (Signed)
RT and RN x2 turned patients head to patient left side and moved arm. No complications. RT will continue to monitor.

## 2021-05-02 NOTE — Procedures (Signed)
Bronchoscopy Procedure Note  Mathew Cooke  [id number]  [birth year]  Date:05/02/21  Time:7:11 PM   Provider Performing: Steffanie Dunn   Procedure(s):  Subsequent Therapeutic Aspiration of Tracheobronchial Tree [phone])  Indication(s) Refractory hypoxia  Consent Unable to obtain consent due to emergent nature of procedure.  Anesthesia Per MAR  Time Out Verified patient identification, verified procedure, site/side was marked, verified correct patient position, special equipment/implants available, medications/allergies/relevant history reviewed, required imaging and test results available.   Sterile Technique Usual hand hygiene, masks, gowns, and gloves were used   Procedure Description Bronchoscope advanced through endotracheal tube and into airway.  Airways were examined down to subsegmental level with findings noted below.   Following diagnostic evaluation, Therapeutic aspiration performed in entire left lung , minimal secretions in R lung.  Findings: see above   Complications/Tolerance None; patient tolerated the procedure well.-- persistent but stable saturations in low 80s Chest X-ray is not needed post procedure.   EBL Minimal   Specimen(s) None  Steffanie Dunn, DO 05/02/21 7:17 PM [place] [facility]

## 2021-05-02 NOTE — Progress Notes (Signed)
Mother Adelana updated. She was able to provide additional history about his baseline. He smokes cigarettes and vapes. He has smoked MJ in the past and has restarted some recently.  He had stopped while he was on probation. She was not sure that he had schizoaffective disorder, but reports he mostly has had depression when he has been sad about girlfriends. She reported that she has schizoaffective disorder. He lives with his GF who is pregnant and due in a few weeks. He drinks some, but does not think it is something he has a problem with. All of her questions were answered and we discussed that his clinical course will likely be long and I anticipate that he will eventually require tracheostomy, but he is not yet stable to do this due to the degree of respiratory failure he still has.  Steffanie Dunn, DO 05/02/21 10:23 AM [facility]

## 2021-05-02 NOTE — Progress Notes (Signed)
NAME:  Mathew Cooke, MRN:  [id number], DOB:  27-Feb-1996, LOS: 7 ADMISSION DATE:  04/10/2021, CONSULTATION DATE:  04/29/2021 REFERRING MD:  Alison Murray, CHIEF COMPLAINT:  dyspnea   History of Present Illness:  26 y o M with history of schizoaffective disorder, prior suicide attempt who was BIBEMS from home for dyspnea which had worsened over 2-3d PTA. Nausea, several episodes of emesis today. Left sided pleuritic CP. EMS gave him a liter of fluid.  In ED he was found to have necrotizing pneumonia and was given 1L LR, vanc, ceftriaxone and placed on nasal cannula which was escalated to BiPAP which he failed requiring endotracheal intubation.   Pertinent  Medical History  Schizoaffective disorder, prior suicide attempt  Significant Hospital Events: Including procedures, antibiotic start and stop dates in addition to other pertinent events   12/25 admitted, ETT placed. CT chest with extensive cavitary opacity c/f necrotizing PNA, adjacent air fluid levels in L pleural space. Started on veletri. Vec gtt. ECMO consult >not indicated at time of consult. NE, neo, vaso, solucortef  12/26 off vec gtt. Off neo  12/27 weaning veletri to off Dc solucortef> echo w/ septal motion inferior hypokinesis but no LV WMA. RV function mildly reduced. 12/28 weaning peep/fio2, changing RASS goal to -3. Off pressors. Abx narrowed. Vanc/ceftriaxone and flagyl stopped. Started on Unasyn and azith resumed until U legionella back.   12/29 Vecuronium infusion started, fever 101.8 12/31 Vent - 60%, PEEP 14, P/f 155, pPlat 28, DP 14. Decompensated later in the day, bronched. Proned, back on EPO.   Interim History / Subjective:  Decompensated overnight with higher peak and plateau pressures, refractory hypoxia. Bronched with bilateral lower lobe thick secretions suctioned, but no improvement. Improved overnight after prone positioning and inhaled epo restarted.  Objective   Blood pressure (!) 120/59, pulse (!) 103,  temperature 98.6 F (37 C), temperature source Bladder, resp. rate (!) 24, height 5\' 7"  (1.702 m), weight 73.4 kg, SpO2 97 %.    Vent Mode: PRVC FiO2 (%):  [60 %-100 %] 100 % Set Rate:  [24 bmp] 24 bmp Vt Set:  [530 mL] 530 mL PEEP:  [14 cmH20] 14 cmH20 Plateau Pressure:  [29 cmH20-36 cmH20] 34 cmH20   Intake/Output Summary (Last 24 hours) at 05/02/2021 0747 Last data filed at 05/02/2021 0700 Gross per 24 hour  Intake 3187.8 ml  Output 3075 ml  Net 112.8 ml    Filed Weights   04/30/21 0500 05/01/21 0500 05/02/21 0500  Weight: 71.1 kg 72.1 kg 73.4 kg    Examination: General: critically ill appearing man lying in bed intubated, sedated, under NMB, proned  HEENT: [place]/AT, eyes anicteric Neuro:  RASS -5, R pupil reactive, not able to examine left due to positioning CV: tachycardic, regular rhythm PULM: rhonchi bilaterally, rhales, synchronous on MV. Pplat 30, DP 16 GI: soft, NT  Extremities: mild tibial pitting edema  Skin: no rashes, skin warm and dry, multiple tattoos  7.46/55/213/39  BUN 14 Cr 0.6 Albumin <1.5 T. Bili 3.4 WBC 35.6 H/H 8.3/24.6 Platelets 293 Trach aspirate> abundant WBC, few GPC 12/29 resp culture> normal flora  Resolved Hospital Problem list   Septic shock resolved as of 12/28 Shock liver resolved as of 12/27  Lactic Acidosis   Assessment & Plan:   Acute hypoxic respiratory failure with ARDS due to necrotizing pneumonia, likely aspiration pneumonia -LTVV, 4-8cc/kg IBW with goal Pplat<30 and DP<15-- adjusted Vt to get plateau back to goal.  -VAP prevention protocol -Titrate PEEP & FiO2 per ARDS  ladder -heavy sedation, goal RASS -4 to -5- dilaudid, versed, propofol. Enteral oxycodone added. -continue linezolid, meropenem, eraxis  -repeat respiratory cultures obtained 12/31, 12/29 -goal net negative volume status -con't lasix-- x 3 doses today  Septic shock due to necrotizing PNA with multisystem organ dysfunction (AKI, shock liver, respiratory  failure) -con't broad antimicrobials -wean NE to maintain MAP >65  Acute HFrEF, (EF 45-50%), likely septic CM  -con't tele monitoring -strict I/Os, goal net even or negative volume status -monitor and replete electrolytes -will need follow up ECHO to ensure recovery of EF   AKI due to septic shock   Improved. Mild hypervolemia on exam  -renally dose meds, avoid nephrotoxic meds -strict I/Os -monitor electrolytes  Fluid and electrolyte imbalance: mild hypernatremia- resolved hypocalcemia  -free water flushes  -lasix as above with KCL supplements to prevent dropping  Anemia of Critical Illness Baseline Hgb ~12-13 -transfuse for Hb <7 or hemodynamically significant bleeding -monitor -goal euvolemia  Hyperglycemia, controlled with minimal insulin Due to critical illness, steroids, enteral nutrition -con't SSI  -goal BG 140-180 if needing insulin -d/c levemir when D5 d/c and TF held, but may need to be resumed  Schizoaffective disorder -Prozac on hold while on linezolid -con't seroquel PTA   Best Practice (right click and "Reselect all SmartList Selections" daily)  Diet/type: tubefeeds DVT prophylaxis: LMWH GI prophylaxis: Protonix Lines: Yes needed Foley: Yes needed Code Status:  full code Last date of multidisciplinary goals of care discussion: 12/25 Patient mother was updated, decision was to continue full scope of care while keeping him full code.      Critical Care Time: 38 minutes      Steffanie Dunn, DO 05/02/21 8:16 AM [facility]

## 2021-05-02 NOTE — Progress Notes (Signed)
Lower extremity venous has been completed.   Preliminary results in CV Proc.   Aundra Millet Taksh Hjort 05/02/2021 3:31 PM

## 2021-05-02 NOTE — Progress Notes (Signed)
Patient supined without complications.  Will obtain ABG in an hour.  Will continue to monitor.

## 2021-05-02 NOTE — Progress Notes (Signed)
RT obtained post supine ABG @1510  and reported results to [facility]. ABG as follows: 7.42/58.6/68/38. PF ratio 97. RT will obtain another ABG @1700  and if PF ratio is under 150 we will prone patient again per Dr.Aikam Hellickson.  RT will continue to monitor.

## 2021-05-02 NOTE — Progress Notes (Signed)
RT obtained ABG @1733  and reported to [facility]. ABG as follows: 7.43/52.1/80/34.7. PF ratio 114. RT and RN x4 proned patient @1750  per MD order. Patient ETT before proning was secured with white cloth tape 25@lips . Once proned patient sats 50-60's. RT paged Dr.Azya Barbero, FiO2 increased to 100% and patient suctioned-small amount of tan/yellow secretions. RT then bagged patient with PEEP valve at 15 per Dr.Smith. After 5 minutes patient sats in the 70's. Patient placed back on vent. RT and RN x4 turned patient supine per Dr.Quamaine Webb due to not tolerating prone position. Patient suctioned-moderate amount of tan/yellow secretions. Dr.Axavier Pressley at bedside and ordered a CXR. RT and RN secured a new ETT holder on patient. ETT is 25@lips . No new orders for RT at this time. RT will continue to monitor.

## 2021-05-02 DEATH — deceased

## 2021-05-03 ENCOUNTER — Inpatient Hospital Stay (HOSPITAL_COMMUNITY): Payer: Medicare Other

## 2021-05-03 DIAGNOSIS — J9601 Acute respiratory failure with hypoxia: Secondary | ICD-10-CM | POA: Diagnosis not present

## 2021-05-03 DIAGNOSIS — Z9911 Dependence on respirator [ventilator] status: Secondary | ICD-10-CM | POA: Diagnosis not present

## 2021-05-03 DIAGNOSIS — J80 Acute respiratory distress syndrome: Secondary | ICD-10-CM | POA: Diagnosis not present

## 2021-05-03 LAB — COMPREHENSIVE METABOLIC PANEL
ALT: 32 U/L (ref 0–44)
AST: 30 U/L (ref 15–41)
Albumin: <1.5 g/dL — ABNORMAL LOW (ref 3.5–5.0)
Alkaline Phosphatase: 165 U/L — ABNORMAL HIGH (ref 38–126)
Anion gap: 9 (ref 5–15)
BUN: 19 mg/dL (ref 6–20)
CO2: 36 mmol/L — ABNORMAL HIGH (ref 22–32)
Calcium: 7.8 mg/dL — ABNORMAL LOW (ref 8.9–10.3)
Chloride: 96 mmol/L — ABNORMAL LOW (ref 98–111)
Creatinine, Ser: 0.68 mg/dL (ref 0.61–1.24)
GFR, Estimated: >60 mL/min (ref >60)
Glucose, Bld: 154 mg/dL — ABNORMAL HIGH (ref 70–99)
Potassium: 4.2 mmol/L (ref 3.5–5.1)
Sodium: 141 mmol/L (ref 135–145)
Total Bilirubin: 3 mg/dL — ABNORMAL HIGH (ref 0.3–1.2)
Total Protein: 5.2 g/dL — ABNORMAL LOW (ref 6.5–8.1)

## 2021-05-03 LAB — GLUCOSE, CAPILLARY
Glucose-Capillary: 146 mg/dL — ABNORMAL HIGH (ref 70–99)
Glucose-Capillary: 136 mg/dL — ABNORMAL HIGH (ref 70–99)
Glucose-Capillary: 173 mg/dL — ABNORMAL HIGH (ref 70–99)
Glucose-Capillary: 146 mg/dL — ABNORMAL HIGH (ref 70–99)
Glucose-Capillary: 151 mg/dL — ABNORMAL HIGH (ref 70–99)
Glucose-Capillary: 187 mg/dL — ABNORMAL HIGH (ref 70–99)

## 2021-05-03 LAB — CBC
HCT: 21.9 % — ABNORMAL LOW (ref 39.0–52.0)
Hemoglobin: 7.3 g/dL — ABNORMAL LOW (ref 13.0–17.0)
MCH: 31.1 pg (ref 26.0–34.0)
MCHC: 33.3 g/dL (ref 30.0–36.0)
MCV: 93.2 fL (ref 80.0–100.0)
Platelets: 333 10*3/uL (ref 150–400)
RBC: 2.35 MIL/uL — ABNORMAL LOW (ref 4.22–5.81)
RDW: 14 % (ref 11.5–15.5)
WBC: 43.3 10*3/uL — ABNORMAL HIGH (ref 4.0–10.5)
nRBC: 0.2 % (ref 0.0–0.2)

## 2021-05-03 LAB — MAGNESIUM: Magnesium: 2.4 mg/dL (ref 1.7–2.4)

## 2021-05-03 LAB — TRIGLYCERIDES: Triglycerides: 217 mg/dL — ABNORMAL HIGH (ref <150)

## 2021-05-03 LAB — PHOSPHORUS: Phosphorus: 5.3 mg/dL — ABNORMAL HIGH (ref 2.5–4.6)

## 2021-05-03 MED ORDER — CISATRACURIUM BOLUS VIA INFUSION
5.0000 mg | INTRAVENOUS | Status: DC | PRN
  Administered 2021-05-04 – 2021-05-05 (×3): 5 mg via INTRAVENOUS
  Filled 2021-05-03 (×2): qty 5

## 2021-05-03 MED ORDER — SODIUM CHLORIDE 0.9 % IV SOLN
0.0000 ug/kg/min | INTRAVENOUS | Status: DC
  Administered 2021-05-03: 3 ug/kg/min via INTRAVENOUS
  Administered 2021-05-04: 5 ug/kg/min via INTRAVENOUS
  Administered 2021-05-04: 7 ug/kg/min via INTRAVENOUS
  Administered 2021-05-05 (×2): 6 ug/kg/min via INTRAVENOUS
  Administered 2021-05-06: 3.5 ug/kg/min via INTRAVENOUS
  Administered 2021-05-06: 7 ug/kg/min via INTRAVENOUS
  Administered 2021-05-06: 6 ug/kg/min via INTRAVENOUS
  Administered 2021-05-07: 5 ug/kg/min via INTRAVENOUS
  Filled 2021-05-03 (×11): qty 20

## 2021-05-03 MED ORDER — FUROSEMIDE 10 MG/ML IJ SOLN
60.0000 mg | Freq: Three times a day (TID) | INTRAMUSCULAR | Status: AC
  Administered 2021-05-03 (×3): 60 mg via INTRAVENOUS
  Filled 2021-05-03 (×3): qty 6

## 2021-05-03 MED ORDER — CISATRACURIUM BOLUS VIA INFUSION
5.0000 mg | INTRAVENOUS | Status: DC | PRN
  Filled 2021-05-03: qty 5

## 2021-05-03 MED ORDER — PROSOURCE TF PO LIQD
45.0000 mL | Freq: Three times a day (TID) | ORAL | Status: DC
  Administered 2021-05-03 – 2021-05-09 (×19): 45 mL
  Filled 2021-05-03 (×19): qty 45

## 2021-05-03 MED ORDER — POLYETHYLENE GLYCOL 3350 17 G PO PACK
17.0000 g | PACK | Freq: Every day | ORAL | Status: DC | PRN
Start: 2021-05-03 — End: 2021-05-09

## 2021-05-03 MED ORDER — VITAL 1.5 CAL PO LIQD
1000.0000 mL | ORAL | Status: DC
  Administered 2021-05-03 – 2021-05-09 (×6): 1000 mL
  Filled 2021-05-03: qty 1000

## 2021-05-03 MED ORDER — DOCUSATE SODIUM 50 MG/5ML PO LIQD: 100.0000 mg | Freq: Two times a day (BID) | ORAL | Status: DC | PRN

## 2021-05-03 MED ORDER — CISATRACURIUM BOLUS VIA INFUSION
0.1000 mg/kg | Freq: Once | INTRAVENOUS | Status: AC
  Administered 2021-05-03: 7.2 mg via INTRAVENOUS
  Filled 2021-05-03: qty 8

## 2021-05-03 MED ORDER — FREE WATER
200.0000 mL | Status: DC
  Administered 2021-05-03 – 2021-05-04 (×12): 200 mL

## 2021-05-03 NOTE — Progress Notes (Signed)
eLink Physician-Brief Progress Note Patient Name: Mathew Cooke DOB: [birth year] MRN: [id number]   Date of Service  05/03/2021  HPI/Events of Note  Paralyzed and sedated patient. Changed him from vec to nimbex today. There is still a vec prn order in. they are asking if we could d/c that and change it to prn nimbex so they can run it from the bag.  eICU Interventions  Nimbex prn ordered for vent desynchrony     Intervention Category Intermediate Interventions: Other:  [name] 05/03/2021, 8:55 PM

## 2021-05-03 NOTE — Progress Notes (Signed)
Nutrition Follow-up  DOCUMENTATION CODES:   Not applicable  INTERVENTION:   Tube feeding via Cortrak: - Start Vital 1.5 @ 30 ml/hr and increase by 10 ml q 6 hours to goal rate of 60 ml/hr (1440 ml/day) - Add ProSource TF 45 ml TID - Free was flushes per CCM, currently 200 ml q 2 hours  Tube feeding regimen at goal provides 2280 kcal, 130 grams of protein, and 1100 ml of H2O.  Free water with current flushes: 3500 ml  NUTRITION DIAGNOSIS:   Increased nutrient needs related to acute illness as evidenced by estimated needs.  Ongoing, being addressed via TF  GOAL:   Patient will meet greater than or equal to 90% of their needs  Met via TF at goal  MONITOR:   Vent status, Labs, Weight trends, TF tolerance, Skin, I & O's  REASON FOR ASSESSMENT:   Consult Assessment of nutrition requirement/status  ASSESSMENT:   Pt with PMH of schizoaffective disorder and prior suicide attempt admitted with necrotizing PNA/ARDS.  12/30 - Cortrak placed (tip just beyond pylorus per x-ray) 12/31 - pt proned  Discussed pt with RN and during ICU rounds. Pt with persistent bloating despite having regular BMs. Discussed with RN and CCM. Will trial switching tube feeding formula to Vital 1.5 formula and monitor for improvement. Per CCM, okay to slowly advance tube feeds to goal.  Admit weight: 63.5 kg Current weight: 72 kg  Pt with mild pitting generalized edema. Free water flushes decreased today.  Current TF: Pivot 1.5 @ 30 ml/hr, free water flushes of 200 ml q 2 hours  Patient is currently intubated on ventilator support MV: 12.3 L/min Temp (24hrs), Avg:99.2 F (37.3 C), Min:97.2 F (36.2 C), Max:101.5 F (38.6 C) BP (a-line): 118/61 MAP (a-line): 77  Drips: Propofol: 15.04 ml/hr (provides 397 kcal daily from lipid) Versed Epoprostenol Dilaudid Levophed Vecuronium  Medications reviewed and include: colace, IV lasix, SSI q 4 hours, protonix, miralax, IV eraxis, IV abx, IV  solu-medrol  Labs reviewed: phosphorus 5.3, TG 217, hemoglobin 7.3, WBC 43.3 CBG's: 85-185 x 24 hours  UOP: 5190 ml x 24 hours I/O's: +8.9 L since admit  NUTRITION - FOCUSED PHYSICAL EXAM:  Flowsheet Row Most Recent Value  Orbital Region No depletion  Upper Arm Region No depletion  Thoracic and Lumbar Region Mild depletion  Buccal Region Unable to assess  Temple Region No depletion  Clavicle Bone Region Mild depletion  Clavicle and Acromion Bone Region Mild depletion  Scapular Bone Region Mild depletion  Dorsal Hand No depletion  Patellar Region No depletion  Anterior Thigh Region Mild depletion  Posterior Calf Region No depletion  Edema (RD Assessment) Mild  [generalized]  Hair Reviewed  Eyes Unable to assess  Mouth Unable to assess  Skin Reviewed  Nails Reviewed       Diet Order:   Diet Order             Diet NPO time specified  Diet effective now                   EDUCATION NEEDS:   No education needs have been identified at this time  Skin:  Skin Assessment: Skin Integrity Issues: DTI: R ear  Last BM:  05/03/21 large type 7  Height:   Ht Readings from Last 1 Encounters:  04/13/2021 _0  (1.702 m)    Weight:   Wt Readings from Last 1 Encounters:  05/03/21 72 kg    BMI:  Body mass index  is 24.86 kg/m.  Estimated Nutritional Needs:   Kcal:  2000-2300  Protein:  115-135 grams  Fluid:  > 2 L/day    Gustavus Bryant, MS, RD, LDN Inpatient Clinical Dietitian Please see AMiON for contact information.

## 2021-05-03 NOTE — Progress Notes (Signed)
NAME:  Mathew Cooke, MRN:  [id number], DOB:  04/01/1996, LOS: 8 ADMISSION DATE:  04/27/21, CONSULTATION DATE:  04/27/2021 REFERRING MD:  Alison Murray, CHIEF COMPLAINT:  dyspnea   History of Present Illness:  26 y o M with history of schizoaffective disorder, prior suicide attempt who was BIBEMS from home for dyspnea which had worsened over 2-3d PTA. Nausea, several episodes of emesis today. Left sided pleuritic CP. EMS gave him a liter of fluid.  In ED he was found to have necrotizing pneumonia and was given 1L LR, vanc, ceftriaxone and placed on nasal cannula which was escalated to BiPAP which he failed requiring endotracheal intubation.   Pertinent  Medical History  Schizoaffective disorder, prior suicide attempt  Significant Hospital Events: Including procedures, antibiotic start and stop dates in addition to other pertinent events   12/25 admitted, ETT placed. CT chest with extensive cavitary opacity c/f necrotizing PNA, adjacent air fluid levels in L pleural space. Started on veletri. Vec gtt. ECMO consult >not indicated at time of consult. NE, neo, vaso, solucortef  12/26 off vec gtt. Off neo  12/27 weaning veletri to off Dc solucortef> echo w/ septal motion inferior hypokinesis but no LV WMA. RV function mildly reduced. 12/28 weaning peep/fio2, changing RASS goal to -3. Off pressors. Abx narrowed. Vanc/ceftriaxone and flagyl stopped. Started on Unasyn and azith resumed until U legionella back.   12/29 Vecuronium infusion started, fever 101.8 12/31 Vent - 60%, PEEP 14, P/f 155, pPlat 28, DP 14. Decompensated later in the day, bronched. Proned, back on EPO.   Interim History / Subjective:  Remains intubated, sedated. Did not tolerate proning last night.   Objective   Blood pressure (!) 116/56, pulse 85, temperature 98.3 F (36.8 C), temperature source Core, resp. rate (!) 25, height 5\' 7"  (1.702 m), weight 72 kg, SpO2 96 %.    Vent Mode: PRVC FiO2 (%):  [70 %-100 %] 100 % Set  Rate:  [5 bmp-25 bmp] 5 bmp Vt Set:  [500 mL] 500 mL PEEP:  [14 cmH20] 14 cmH20 Plateau Pressure:  [29 cmH20-33 cmH20] 31 cmH20   Intake/Output Summary (Last 24 hours) at 05/03/2021 0719 Last data filed at 05/03/2021 0700 Gross per 24 hour  Intake 5056.43 ml  Output 5415 ml  Net -358.57 ml    Filed Weights   05/01/21 0500 05/02/21 0500 05/03/21 0358  Weight: 72.1 kg 73.4 kg 72 kg    Examination: General: critically ill appearing man lying in bed intubated, sedated, under NMB HEENT: [place]/AT, eyes anicteric Neuro:  RASS -5, PERRL CV: tachycardic, reg rhythm PULM: minimal rhonchi, CTA anteriorly, no ETT secretions when suctioned GI: soft, NT, less distention Extremities: minimal dependent edema Skin: warm, dry, no rashes. Multiple tattoos.  7.46/55/213/39  BUN 19 Cr 0.68 Albumin <1.5 T. Bili 3.0 WBC 43.3 H/H 7.3/21.9 Platelets 333 Trach aspirate> abundant WBC, few GPC>NGTD   Resolved Hospital Problem list   Septic shock resolved as of 12/28 Shock liver resolved as of 12/27  Lactic Acidosis   Assessment & Plan:   Acute hypoxic respiratory failure with ARDS due to necrotizing pneumonia, likely aspiration pneumonia -Low tidal volume ventilation, 4 to 8 cc/kg ideal body weight with goal plateau less than 30 and driving pressure less than 15-20.  P plat 30, driving pressure 16. -Titrate PEEP and FiO2 per ARDS letter. - VAP prevention protocol - Still needs heavy sedation with goal RASS -4 to -5.  Continue neuromuscular blockade. - Needs aggressive pulmonary hygiene.  Has decompensated the last  2 days associated with secretions.  Has required bronchoscopy for the last 2 evenings.  May do better with bronchoscopy before attempted proning due to copious secretions that are too distal for suctioning with the [place]. -continue linezolid, meropenem, eraxis-although cultures have grown nothing his white count is trending upward and he is overall not improving -repeat respiratory  cultures (routine and fungal) obtained 12/31-follow until finalized -goal net negative volume status -con't lasix-- x 3 doses today, increased dose -Continue inhaled EPO.  Resumed on 12/31 after previously tolerating weaning off.  Septic shock due to necrotizing PNA with multisystem organ dysfunction (AKI, shock liver, respiratory failure) -Continue broad antimicrobials, follow trach aspirate cultures -Norepinephrine as required to maintain MAP greater than 65, wean as tolerated  Acute HFrEF, (EF 45-50%), likely septic CM  -Continue telemetry monitoring - Strict I's/O -Goal net even or negative volume status -Will eventually need follow-up echo to ensure recovery of ejection fraction.  AKI due to septic shock  hyperphosphatemia  Improved. Mild hypervolemia on exam  -Renally dose medications, avoid nephrotoxic meds - Strict I's/O - Continue Foley while under NMB - Monitor electrolytes -Continue to monitor phosphorus, no additional supplementation  Fluid and electrolyte imbalance: mild hypernatremia- resolved hypocalcemia  -Decrease free water flushes to 200 every 2h -Continue to monitor electrolytes -Lasix required to maintain net even volume status, increasing dose today.  Anemia of Critical Illness Baseline Hgb ~12-13 -Continues for hemoglobin less than 7 or hemodynamically significant bleeding.  Continue to monitor. -goal euvolemia  Hyperglycemia, controlled with minimal insulin Due to critical illness, steroids, enteral nutrition -Continue sliding scale insulin as needed - Goal BG 140-180  Schizoaffective disorder -PTA prozac & seroquel on hold while on linezolid  Tobacco abuse MJ use Vaping -We will counsel on the importance of avoiding these when appropriate  Best Practice (right click and "Reselect all SmartList Selections" daily)  Diet/type: tubefeeds DVT prophylaxis: LMWH GI prophylaxis: Protonix Lines: Yes needed Foley: Yes needed Code Status:  full  code Last date of multidisciplinary goals of care discussion: 1/1 both parents updated, continue aggressive care    Critical Care Time: 40 minutes      Steffanie Dunn, DO 05/03/21 8:06 AM [facility]

## 2021-05-04 ENCOUNTER — Inpatient Hospital Stay (HOSPITAL_COMMUNITY): Payer: Medicare Other

## 2021-05-04 DIAGNOSIS — J9601 Acute respiratory failure with hypoxia: Secondary | ICD-10-CM | POA: Diagnosis not present

## 2021-05-04 LAB — CBC
HCT: 19.6 % — ABNORMAL LOW (ref 39.0–52.0)
Hemoglobin: 6.6 g/dL — CL (ref 13.0–17.0)
MCH: 31.4 pg (ref 26.0–34.0)
MCHC: 33.7 g/dL (ref 30.0–36.0)
MCV: 93.3 fL (ref 80.0–100.0)
Platelets: 354 10*3/uL (ref 150–400)
RBC: 2.1 MIL/uL — ABNORMAL LOW (ref 4.22–5.81)
RDW: 14.1 % (ref 11.5–15.5)
WBC: 39.6 10*3/uL — ABNORMAL HIGH (ref 4.0–10.5)
nRBC: 0.4 % — ABNORMAL HIGH (ref 0.0–0.2)

## 2021-05-04 LAB — COMPREHENSIVE METABOLIC PANEL
ALT: 28 U/L (ref 0–44)
AST: 27 U/L (ref 15–41)
Albumin: <1.5 g/dL — ABNORMAL LOW (ref 3.5–5.0)
Alkaline Phosphatase: 163 U/L — ABNORMAL HIGH (ref 38–126)
Anion gap: 7 (ref 5–15)
BUN: 30 mg/dL — ABNORMAL HIGH (ref 6–20)
CO2: 37 mmol/L — ABNORMAL HIGH (ref 22–32)
Calcium: 7.7 mg/dL — ABNORMAL LOW (ref 8.9–10.3)
Chloride: 97 mmol/L — ABNORMAL LOW (ref 98–111)
Creatinine, Ser: 0.69 mg/dL (ref 0.61–1.24)
GFR, Estimated: >60 mL/min (ref >60)
Glucose, Bld: 154 mg/dL — ABNORMAL HIGH (ref 70–99)
Potassium: 3.4 mmol/L — ABNORMAL LOW (ref 3.5–5.1)
Sodium: 141 mmol/L (ref 135–145)
Total Bilirubin: 1.3 mg/dL — ABNORMAL HIGH (ref 0.3–1.2)
Total Protein: 5.5 g/dL — ABNORMAL LOW (ref 6.5–8.1)

## 2021-05-04 LAB — POCT I-STAT 7, (LYTES, BLD GAS, ICA,H+H)
Acid-Base Excess: 16 mmol/L — ABNORMAL HIGH (ref 0.0–2.0)
Bicarbonate: 42 mmol/L — ABNORMAL HIGH (ref 20.0–28.0)
Calcium, Ion: 1.13 mmol/L — ABNORMAL LOW (ref 1.15–1.40)
HCT: 20 % — ABNORMAL LOW (ref 39.0–52.0)
Hemoglobin: 6.8 g/dL — CL (ref 13.0–17.0)
O2 Saturation: 100 %
Patient temperature: 97.8
Potassium: 3.5 mmol/L (ref 3.5–5.1)
Sodium: 139 mmol/L (ref 135–145)
TCO2: 44 mmol/L — ABNORMAL HIGH (ref 22–32)
pCO2 arterial: 59.6 mmHg — ABNORMAL HIGH (ref 32.0–48.0)
pH, Arterial: 7.454 — ABNORMAL HIGH (ref 7.350–7.450)
pO2, Arterial: 240 mmHg — ABNORMAL HIGH (ref 83.0–108.0)

## 2021-05-04 LAB — GLUCOSE, CAPILLARY
Glucose-Capillary: 115 mg/dL — ABNORMAL HIGH (ref 70–99)
Glucose-Capillary: 140 mg/dL — ABNORMAL HIGH (ref 70–99)
Glucose-Capillary: 147 mg/dL — ABNORMAL HIGH (ref 70–99)
Glucose-Capillary: 160 mg/dL — ABNORMAL HIGH (ref 70–99)
Glucose-Capillary: 161 mg/dL — ABNORMAL HIGH (ref 70–99)
Glucose-Capillary: 192 mg/dL — ABNORMAL HIGH (ref 70–99)

## 2021-05-04 LAB — CULTURE, RESPIRATORY W GRAM STAIN: Gram Stain: NONE SEEN

## 2021-05-04 LAB — HEMOGLOBIN AND HEMATOCRIT, BLOOD
HCT: 24.4 % — ABNORMAL LOW (ref 39.0–52.0)
Hemoglobin: 8.2 g/dL — ABNORMAL LOW (ref 13.0–17.0)

## 2021-05-04 LAB — MAGNESIUM: Magnesium: 2.6 mg/dL — ABNORMAL HIGH (ref 1.7–2.4)

## 2021-05-04 LAB — PREPARE RBC (CROSSMATCH)

## 2021-05-04 LAB — PHOSPHORUS: Phosphorus: 4 mg/dL (ref 2.5–4.6)

## 2021-05-04 MED ORDER — POTASSIUM CHLORIDE 10 MEQ/100ML IV SOLN
10.0000 meq | INTRAVENOUS | Status: AC
  Administered 2021-05-04 (×2): 10 meq via INTRAVENOUS
  Filled 2021-05-04 (×2): qty 100

## 2021-05-04 MED ORDER — ACETAZOLAMIDE 250 MG PO TABS
500.0000 mg | ORAL_TABLET | Freq: Once | ORAL | Status: AC
  Administered 2021-05-04: 500 mg
  Filled 2021-05-04: qty 2

## 2021-05-04 MED ORDER — POTASSIUM CHLORIDE 20 MEQ PO PACK
20.0000 meq | PACK | Freq: Once | ORAL | Status: AC
  Administered 2021-05-04: 20 meq
  Filled 2021-05-04: qty 1

## 2021-05-04 MED ORDER — SODIUM CHLORIDE 0.9% IV SOLUTION: Freq: Once | INTRAVENOUS | Status: AC

## 2021-05-04 MED ORDER — CLONAZEPAM 1 MG PO TABS
2.0000 mg | ORAL_TABLET | Freq: Two times a day (BID) | ORAL | Status: DC
  Administered 2021-05-04 – 2021-05-07 (×7): 2 mg
  Filled 2021-05-04 (×7): qty 2

## 2021-05-04 MED ORDER — FUROSEMIDE 10 MG/ML IJ SOLN
60.0000 mg | Freq: Once | INTRAMUSCULAR | Status: AC
  Administered 2021-05-04: 60 mg via INTRAVENOUS
  Filled 2021-05-04: qty 6

## 2021-05-04 MED ORDER — FREE WATER
200.0000 mL | Freq: Four times a day (QID) | Status: DC
  Administered 2021-05-04 – 2021-05-08 (×15): 200 mL

## 2021-05-04 NOTE — Progress Notes (Signed)
Veletri increased 69mL due to pt desating to the low 80's.

## 2021-05-04 NOTE — Progress Notes (Signed)
Epo weaned to 53mL.

## 2021-05-04 NOTE — Progress Notes (Signed)
Veletri weaned to 14mL.

## 2021-05-04 NOTE — Progress Notes (Signed)
eLink Physician-Brief Progress Note Patient Name: Mathew Cooke DOB: 09/17/1995 MRN: [id number]   Date of Service  05/04/2021  HPI/Events of Note  ABG 7.54, 59.6, 240, 42 25/510/100%/14 PEEP RT has come down on the Fio2 to 70% Peak pressure 34  Hgb  6.6, no active bleed  eICU Interventions  Decreased TV to 420 (6-7 cc/kg IBW) lung protective, permissive hypercapnea. Peak pressure came down to 27  Transfuse 1 unit PRBC. Consent to be obtained by bedside team.  Discussed with bedside RN     Intervention Category Intermediate Interventions: Other:  [name] 05/04/2021, 4:52 AM

## 2021-05-04 NOTE — Progress Notes (Signed)
NAME:  Mathew Cooke, MRN:  [id number], DOB:  1995/12/15, LOS: 9 ADMISSION DATE:  May 23, 2021, CONSULTATION DATE:  05-23-2021 REFERRING MD:  Alison Murray, CHIEF COMPLAINT:  dyspnea   History of Present Illness:  26 y o M with history of schizoaffective disorder, prior suicide attempt who was BIBEMS from home for dyspnea which had worsened over 2-3d PTA. Nausea, several episodes of emesis today. Left sided pleuritic CP. EMS gave him a liter of fluid.  In ED he was found to have necrotizing pneumonia and was given 1L LR, vanc, ceftriaxone and placed on nasal cannula which was escalated to BiPAP which he failed requiring endotracheal intubation.   Pertinent  Medical History  Schizoaffective disorder, prior suicide attempt  Significant Hospital Events: Including procedures, antibiotic start and stop dates in addition to other pertinent events   12/25 admitted, ETT placed. CT chest with extensive cavitary opacity c/f necrotizing PNA, adjacent air fluid levels in L pleural space. Started on veletri. Vec gtt. ECMO consult >not indicated at time of consult. NE, neo, vaso, solucortef  12/26 off vec gtt. Off neo  12/27 weaning veletri to off Dc solucortef> echo w/ septal motion inferior hypokinesis but no LV WMA. RV function mildly reduced. 12/28 weaning peep/fio2, changing RASS goal to -3. Off pressors. Abx narrowed. Vanc/ceftriaxone and flagyl stopped. Started on Unasyn and azith resumed until U legionella back.   12/29 Vecuronium infusion started, fever 101.8 12/31 Vent - 60%, PEEP 14, P/f 155, pPlat 28, DP 14. Decompensated later in the day, bronched. Proned, back on EPO. 1/1 bronch  1/2 did not tolerate proning  1/3 1 PRBC   Interim History / Subjective:  Off pressors last night, has stayed off  1 PRBC this morning for hgb 6.6  ABG this morning 7.45/59/240/44/16/42   Objective   Blood pressure (!) 112/59, pulse 81, temperature 98.2 F (36.8 C), resp. rate (!) 25, height 5\' 7"  (1.702 m),  weight 74.7 kg, SpO2 92 %.    Vent Mode: PRVC FiO2 (%):  [70 %-90 %] 70 % Set Rate:  [25 bmp] 25 bmp Vt Set:  [420 mL-510 mL] 420 mL PEEP:  [14 cmH20] 14 cmH20 Plateau Pressure:  [30 cmH20-33 cmH20] 30 cmH20   Intake/Output Summary (Last 24 hours) at 05/04/2021 0945 Last data filed at 05/04/2021 0831 Gross per 24 hour  Intake 5540.49 ml  Output 4385 ml  Net 1155.49 ml   Filed Weights   05/02/21 0500 05/03/21 0358 05/04/21 0500  Weight: 73.4 kg 72 kg 74.7 kg    Examination: General: Critically ill young adult M intubated sedated paralyzed  HEENT: ETT secure anicteric sclera trachea midline  Neuro:  PERRL. RASS -5 chemically paralyzed  CV: rrr s1s2 no rgm cap refill brisk  PULM: Slight rhonchi. Symmetrical chest expansion. Diminished basilar sounds  GI: Soft flat. Hypoactive  Extremities: no acute joint deformity. No cyanosis. No clubbing  Skin: c/d/w no rash    Resolved Hospital Problem list   Septic shock resolved as of 12/28 Shock liver resolved as of 12/27  Lactic Acidosis  AKI Hyperphosphatemia   Assessment & Plan:   Acute hypoxic respiratory failure with ARDS due to necrotizing PNA, likely aspiration PNA  L pleural effusion  -1/3 ABG 7.45/59.6/240/44/16/42  -- bit of a metabolic alkalosis  P -lung protective ventilation -cont nimbex gtt andRASS -5 -aggressive pulm hygiene  -cont Epo  -- weaning to 40 1/3  -linezolid, mero, eraxis  -follow cx data -- nothing so far -AM CXR and PRN ABG  -  cont diuresis -- 1/3 will do lasix + 1x diamox -notably has decompensated previously with proning -- if this were to become indicated again, consider bronch before proning.  -anticipate will likely need trach   Septic shock due to necrotizing PNA with multisystem organ dysfunction (AKI, shock liver, respiratory failure, septic cardiomyopathy) -- improving shock  P -cont broad antimicrobials -- linezolid, mero, eraxis  - off NE 1/3. Available if needed for MAP > 65   Acute  HFrEF  -- likely septic CM  -LVEF 45-50% P -Continue telemetry monitoring - Strict I's/O -Goal net even or negative volume status -Will eventually need follow-up echo to ensure recovery of ejection fraction.  Anemia of critical illness  Baseline Hgb ~12-13 1 PRBC 1/3 -- no obvious bleeding  P -hgb  -goal euvolemia  Hypokalemia -due to aggressive diuresis P -replace, cont to trend   Hyperglycemia Due to critical illness, steroids, enteral nutrition P -SSI -- goal 140-180  Hx schizoaffective disorder  -PTA prozac & seroquel on hold while on linezolid  Tobacco abuse THC use  Vaping -We will counsel on the importance of avoiding these when appropriate  Best Practice (right click and "Reselect all SmartList Selections" daily)  Diet/type: tubefeeds DVT prophylaxis: LMWH GI prophylaxis: Protonix Lines: Yes needed Foley: Yes needed Code Status:  full code Last date of multidisciplinary goals of care discussion: 1/1 both parents updated, continue aggressive care    CRITICAL CARE Performed by: Lanier Clam   Total critical care time: 48 minutes  Critical care time was exclusive of separately billable procedures and treating other patients. Critical care was necessary to treat or prevent imminent or life-threatening deterioration.  Critical care was time spent personally by me on the following activities: development of treatment plan with patient and/or surrogate as well as nursing, discussions with consultants, evaluation of patient's response to treatment, examination of patient, obtaining history from patient or surrogate, ordering and performing treatments and interventions, ordering and review of laboratory studies, ordering and review of radiographic studies, pulse oximetry and re-evaluation of patient's condition.  Tessie Fass MSN, AGACNP-BC [facility]/Critical Care Medicine Amion for pager  05/04/2021, 9:45 AM

## 2021-05-04 NOTE — Progress Notes (Signed)
Increased Veletri back to 45mL due to decreased Sp02 below 85%

## 2021-05-04 NOTE — Progress Notes (Addendum)
Veletri weaned from 40 to 30 to 20 to maintain Sp02 of 85% or greater per Dr Kendrick Fries. Will continue to monitor.

## 2021-05-04 NOTE — Progress Notes (Signed)
Veletri weaned to 61mL

## 2021-05-04 NOTE — Progress Notes (Signed)
° °  Spoke with pts father, clinical updates provided and all questions answered.  Tried to reach pts mother via phone as well, unable to connect.    Tessie Fass MSN, AGACNP-BC [facility] Amion for pager  05/04/2021, 3:00 PM

## 2021-05-05 ENCOUNTER — Inpatient Hospital Stay (HOSPITAL_COMMUNITY): Payer: Medicare Other

## 2021-05-05 DIAGNOSIS — J9601 Acute respiratory failure with hypoxia: Secondary | ICD-10-CM | POA: Diagnosis not present

## 2021-05-05 DIAGNOSIS — J80 Acute respiratory distress syndrome: Secondary | ICD-10-CM | POA: Diagnosis not present

## 2021-05-05 LAB — MAGNESIUM: Magnesium: 2.7 mg/dL — ABNORMAL HIGH (ref 1.7–2.4)

## 2021-05-05 LAB — GLUCOSE, CAPILLARY
Glucose-Capillary: 155 mg/dL — ABNORMAL HIGH (ref 70–99)
Glucose-Capillary: 159 mg/dL — ABNORMAL HIGH (ref 70–99)
Glucose-Capillary: 170 mg/dL — ABNORMAL HIGH (ref 70–99)
Glucose-Capillary: 174 mg/dL — ABNORMAL HIGH (ref 70–99)
Glucose-Capillary: 182 mg/dL — ABNORMAL HIGH (ref 70–99)
Glucose-Capillary: 211 mg/dL — ABNORMAL HIGH (ref 70–99)

## 2021-05-05 LAB — POCT I-STAT 7, (LYTES, BLD GAS, ICA,H+H)
Acid-Base Excess: 12 mmol/L — ABNORMAL HIGH (ref 0.0–2.0)
Acid-Base Excess: 12 mmol/L — ABNORMAL HIGH (ref 0.0–2.0)
Acid-Base Excess: 12 mmol/L — ABNORMAL HIGH (ref 0.0–2.0)
Acid-Base Excess: 12 mmol/L — ABNORMAL HIGH (ref 0.0–2.0)
Bicarbonate: 41.5 mmol/L — ABNORMAL HIGH (ref 20.0–28.0)
Bicarbonate: 39.5 mmol/L — ABNORMAL HIGH (ref 20.0–28.0)
Bicarbonate: 39 mmol/L — ABNORMAL HIGH (ref 20.0–28.0)
Bicarbonate: 39.1 mmol/L — ABNORMAL HIGH (ref 20.0–28.0)
Calcium, Ion: 1.16 mmol/L (ref 1.15–1.40)
Calcium, Ion: 1.19 mmol/L (ref 1.15–1.40)
Calcium, Ion: 1.21 mmol/L (ref 1.15–1.40)
Calcium, Ion: 1.23 mmol/L (ref 1.15–1.40)
HCT: 34 % — ABNORMAL LOW (ref 39.0–52.0)
HCT: 23 % — ABNORMAL LOW (ref 39.0–52.0)
HCT: 24 % — ABNORMAL LOW (ref 39.0–52.0)
HCT: 25 % — ABNORMAL LOW (ref 39.0–52.0)
Hemoglobin: 11.6 g/dL — ABNORMAL LOW (ref 13.0–17.0)
Hemoglobin: 7.8 g/dL — ABNORMAL LOW (ref 13.0–17.0)
Hemoglobin: 8.2 g/dL — ABNORMAL LOW (ref 13.0–17.0)
Hemoglobin: 8.5 g/dL — ABNORMAL LOW (ref 13.0–17.0)
O2 Saturation: 100 %
O2 Saturation: 99 %
O2 Saturation: 91 %
O2 Saturation: 82 %
Patient temperature: 99.2
Patient temperature: 102.2
Potassium: 3.5 mmol/L (ref 3.5–5.1)
Potassium: 3.5 mmol/L (ref 3.5–5.1)
Potassium: 4.5 mmol/L (ref 3.5–5.1)
Potassium: 4.1 mmol/L (ref 3.5–5.1)
Sodium: 145 mmol/L (ref 135–145)
Sodium: 144 mmol/L (ref 135–145)
Sodium: 145 mmol/L (ref 135–145)
Sodium: 147 mmol/L — ABNORMAL HIGH (ref 135–145)
TCO2: 44 mmol/L — ABNORMAL HIGH (ref 22–32)
TCO2: 42 mmol/L — ABNORMAL HIGH (ref 22–32)
TCO2: 41 mmol/L — ABNORMAL HIGH (ref 22–32)
TCO2: 41 mmol/L — ABNORMAL HIGH (ref 22–32)
pCO2 arterial: 89.5 mmHg (ref 32.0–48.0)
pCO2 arterial: 70.4 mmHg (ref 32.0–48.0)
pCO2 arterial: 70.4 mmHg (ref 32.0–48.0)
pCO2 arterial: 79.3 mmHg (ref 32.0–48.0)
pH, Arterial: 7.276 — ABNORMAL LOW (ref 7.350–7.450)
pH, Arterial: 7.357 (ref 7.350–7.450)
pH, Arterial: 7.351 (ref 7.350–7.450)
pH, Arterial: 7.311 — ABNORMAL LOW (ref 7.350–7.450)
pO2, Arterial: 233 mmHg — ABNORMAL HIGH (ref 83.0–108.0)
pO2, Arterial: 149 mmHg — ABNORMAL HIGH (ref 83.0–108.0)
pO2, Arterial: 69 mmHg — ABNORMAL LOW (ref 83.0–108.0)
pO2, Arterial: 59 mmHg — ABNORMAL LOW (ref 83.0–108.0)

## 2021-05-05 LAB — CBC
HCT: 24.1 % — ABNORMAL LOW (ref 39.0–52.0)
Hemoglobin: 7.8 g/dL — ABNORMAL LOW (ref 13.0–17.0)
MCH: 31.6 pg (ref 26.0–34.0)
MCHC: 32.4 g/dL (ref 30.0–36.0)
MCV: 97.6 fL (ref 80.0–100.0)
Platelets: 418 10*3/uL — ABNORMAL HIGH (ref 150–400)
RBC: 2.47 MIL/uL — ABNORMAL LOW (ref 4.22–5.81)
RDW: 14.6 % (ref 11.5–15.5)
WBC: 41.9 10*3/uL — ABNORMAL HIGH (ref 4.0–10.5)
nRBC: 1 % — ABNORMAL HIGH (ref 0.0–0.2)

## 2021-05-05 LAB — COMPREHENSIVE METABOLIC PANEL
ALT: 30 U/L (ref 0–44)
AST: 27 U/L (ref 15–41)
Albumin: 1.6 g/dL — ABNORMAL LOW (ref 3.5–5.0)
Alkaline Phosphatase: 157 U/L — ABNORMAL HIGH (ref 38–126)
Anion gap: 7 (ref 5–15)
BUN: 29 mg/dL — ABNORMAL HIGH (ref 6–20)
CO2: 37 mmol/L — ABNORMAL HIGH (ref 22–32)
Calcium: 8.2 mg/dL — ABNORMAL LOW (ref 8.9–10.3)
Chloride: 101 mmol/L (ref 98–111)
Creatinine, Ser: 0.66 mg/dL (ref 0.61–1.24)
GFR, Estimated: >60 mL/min (ref >60)
Glucose, Bld: 167 mg/dL — ABNORMAL HIGH (ref 70–99)
Potassium: 3.5 mmol/L (ref 3.5–5.1)
Sodium: 145 mmol/L (ref 135–145)
Total Bilirubin: 1.2 mg/dL (ref 0.3–1.2)
Total Protein: 5.7 g/dL — ABNORMAL LOW (ref 6.5–8.1)

## 2021-05-05 MED ORDER — SUCCINYLCHOLINE CHLORIDE 200 MG/10ML IV SOSY
PREFILLED_SYRINGE | INTRAVENOUS | Status: AC
  Filled 2021-05-05: qty 10

## 2021-05-05 MED ORDER — FENTANYL CITRATE (PF) 100 MCG/2ML IJ SOLN
INTRAMUSCULAR | Status: AC
  Filled 2021-05-05: qty 2

## 2021-05-05 MED ORDER — ETOMIDATE 2 MG/ML IV SOLN
INTRAVENOUS | Status: AC
  Filled 2021-05-05: qty 20

## 2021-05-05 MED ORDER — TRANEXAMIC ACID FOR INHALATION
500.0000 mg | Freq: Once | RESPIRATORY_TRACT | Status: DC
  Filled 2021-05-05: qty 10

## 2021-05-05 MED ORDER — POTASSIUM CHLORIDE 20 MEQ PO PACK
40.0000 meq | PACK | ORAL | Status: AC
  Administered 2021-05-05 (×2): 40 meq
  Filled 2021-05-05 (×2): qty 2

## 2021-05-05 MED ORDER — ROCURONIUM BROMIDE 10 MG/ML (PF) SYRINGE
PREFILLED_SYRINGE | INTRAVENOUS | Status: AC
  Filled 2021-05-05: qty 10

## 2021-05-05 MED ORDER — LIDOCAINE HCL (PF) 1 % IJ SOLN
INTRAMUSCULAR | Status: AC
  Filled 2021-05-05: qty 5

## 2021-05-05 MED ORDER — MIDAZOLAM HCL 2 MG/2ML IJ SOLN
INTRAMUSCULAR | Status: AC
  Filled 2021-05-05: qty 2

## 2021-05-05 MED ORDER — KETAMINE HCL 50 MG/5ML IJ SOSY
PREFILLED_SYRINGE | INTRAMUSCULAR | Status: AC
  Filled 2021-05-05: qty 5

## 2021-05-05 MED ORDER — FUROSEMIDE 10 MG/ML IJ SOLN
40.0000 mg | Freq: Two times a day (BID) | INTRAMUSCULAR | Status: AC
  Administered 2021-05-05 (×2): 40 mg via INTRAVENOUS
  Filled 2021-05-05 (×2): qty 4

## 2021-05-05 MED ORDER — SODIUM CHLORIDE 0.9 % IV SOLN
2.0000 mg/kg/h | INTRAVENOUS | Status: DC
  Administered 2021-05-05 (×2): 1.5 mg/kg/h via INTRAVENOUS
  Administered 2021-05-05: 1 mg/kg/h via INTRAVENOUS
  Administered 2021-05-06 (×2): 2 mg/kg/h via INTRAVENOUS
  Administered 2021-05-06: 1.5 mg/kg/h via INTRAVENOUS
  Administered 2021-05-06 – 2021-05-07 (×5): 2 mg/kg/h via INTRAVENOUS
  Filled 2021-05-05 (×18): qty 5

## 2021-05-05 MED ORDER — KETAMINE BOLUS VIA INFUSION
0.5000 mg/kg | Freq: Once | INTRAVENOUS | Status: AC
  Administered 2021-05-05: 36.55 mg via INTRAVENOUS
  Filled 2021-05-05: qty 40

## 2021-05-05 MED ORDER — ETOMIDATE 2 MG/ML IV SOLN
INTRAVENOUS | Status: AC
  Filled 2021-05-05: qty 10

## 2021-05-05 MED ORDER — TRANEXAMIC ACID-NACL 1000-0.7 MG/100ML-% IV SOLN
1000.0000 mg | Freq: Once | INTRAVENOUS | Status: AC
  Administered 2021-05-05: 1000 mg via INTRAVENOUS
  Filled 2021-05-05: qty 100

## 2021-05-05 NOTE — Progress Notes (Signed)
Notified Elink MD about new reoccurring cuff leak requiring having to add additional cc's to cuff with respiratory therapy roughly every 30 minutes since 0430. When cuff leak occurs, patient becomes tachycardic, and systolic blood pressure increases into the 150's. After instilling air into the cuff patients HR and blood pressure returns to baseline.

## 2021-05-05 NOTE — Procedures (Signed)
Bronchoscopy Procedure Note  Mathew Cooke  [id number]  16-Nov-1995  Date:05/05/21  Time:6:05 PM   Provider Performing:Brent Marykate Heuberger   Procedure(s):  Flexible Bronchoscopy [phone]) and Initial Therapeutic Aspiration of Tracheobronchial Tree [phone])  Indication(s) Worsening hypoxemic respiratory failure   Consent Risks of the procedure as well as the alternatives and risks of each were explained to the patient and/or caregiver.  Consent for the procedure was obtained and is signed in the bedside chart  Anesthesia ICU mechancial ventilation sedation: propofol, versed, dilaudid, ketamine   Time Out Verified patient identification, verified procedure, site/side was marked, verified correct patient position, special equipment/implants available, medications/allergies/relevant history reviewed, required imaging and test results available.   Sterile Technique Usual hand hygiene, masks, gowns, and gloves were used   Procedure Description Bronchoscope advanced through endotracheal tube and into airway.  Airways were examined down to subsegmental level with findings noted below.   Following diagnostic evaluation, Therapeutic aspiration performed in right lower lobe and the left lower lobe: blood clot easily removed which was occluding the airway   Findings: The trachea was normal, the carina was sharp, there were no visible airway lesions in the tracheobronchial tree.  In the left lower lobe there was a thick blood clot with some surrounding bright red blood.  This was suctioned and removed easily, this revealed mild oozing diffusely in the left lower lobe.  In the right lung there was a blood clot which was occluding the right lower lobe.  This was suctioned and completely removed.  No bleeding noted in the right.    Complications/Tolerance None; patient tolerated the procedure well. Chest X-ray is not needed post procedure.   EBL None from procedure   Specimen(s) None  Roselie Awkward, MD [facility] Pager: [phone] Cell: [phone] After 7:00 pm call Elink  [phone]

## 2021-05-05 NOTE — Progress Notes (Signed)
Nutrition Follow-up ° °DOCUMENTATION CODES:  ° °Not applicable ° °INTERVENTION:  ° °Continue tube feeding via post-pyloric Cortrak: °- Vital 1.5 @ 60 ml/hr (1440 ml/day) °- ProSource TF 45 ml TID °- Free was flushes per CCM, currently 200 ml q 6 hours °  °Tube feeding regimen provides 2280 kcal, 130 grams of protein, and 1100 ml of H2O. °  °Free water with current flushes: 1900 ml ° °NUTRITION DIAGNOSIS:  ° °Increased nutrient needs related to acute illness as evidenced by estimated needs. ° °Ongoing, being addressed via TF ° °GOAL:  ° °Patient will meet greater than or equal to 90% of their needs ° °Met via TF ° °MONITOR:  ° °Vent status, Labs, Weight trends, TF tolerance, Skin, I & O's ° °REASON FOR ASSESSMENT:  ° °Consult °Assessment of nutrition requirement/status ° °ASSESSMENT:  ° °Pt with PMH of schizoaffective disorder and prior suicide attempt admitted with necrotizing PNA/ARDS. ° °12/30 - Cortrak placed (tip just beyond pylorus per x-ray) °12/31 - pt proned (did not tolerate per notes) ° °Discussed pt with RN and during ICU rounds. ETT exchanged this morning. Pt may require proning again. Septic shock has improved per CCM. Pt on Veletri for inhalation. ° °Admit weight: 63.5 kg °Current weight: 73.1 kg ° °Pt with moderate pitting generalized edema, moderate pitting edema to BUE, moderate pitting edema to BLE, and deep pitting edema to perineal region. ° °Current TF: Vital 1.5 @ 60 ml/hr, ProSource TF 45 ml TID, free water flushes 200 ml q 6 hours ° °Patient is currently intubated on ventilator support °MV: 12.4 L/min °Temp (24hrs), Avg:98.3 °F (36.8 °C), Min:97.9 °F (36.6 °C), Max:98.8 °F (37.1 °C) °BP (a-line): 152/66 °MAP (a-line): 90 ° °Drips: °Propofol: 17.18 ml/hr (provides 454 kcal daily from lipid) °Nimbex °Dilaudid °Ketamine °Versed ° °Medications reviewed and include: colace, IV lasix 40 mg BID, SSI q 4 hours, protonix, miralax, klor-con 40 mEq x 2, IV eraxis, IV abx, IV solu-medrol ° °Labs  reviewed: BUN 29, magnesium 2.7, hemoglobin 7.8, HCT 23.0, WBC 41.9 °CBG's: 115-192 x 24 hours ° °UOP: 3770 ml x 24 hours °Rectal tube: 1000 ml + 3 unmeasured occurrences x 24 hours °I/O's: +10.5 L since admit ° °Diet Order:   °Diet Order   ° °       °  Diet NPO time specified  Diet effective now       °  ° °  °  ° °  ° ° °EDUCATION NEEDS:  ° °No education needs have been identified at this time ° °Skin:  Skin Assessment: °Skin Integrity Issues: °DTI: R ear ° °Last BM:  05/05/21 rectal tube ° °Height:  ° °Ht Readings from Last 1 Encounters:  °04/12/2021 5' 7" (1.702 m)  ° ° °Weight:  ° °Wt Readings from Last 1 Encounters:  °05/05/21 73.1 kg  ° ° °BMI:  Body mass index is 25.24 kg/m². ° °Estimated Nutritional Needs:  ° °Kcal:  2200-2400 ° °Protein:  115-135 grams ° °Fluid:  > 2 L/day ° ° ° °Kate , MS, RD, LDN °Inpatient Clinical Dietitian °Please see AMiON for contact information. ° °

## 2021-05-05 NOTE — Progress Notes (Signed)
eLink Physician-Brief Progress Note Patient Name: Mathew Cooke DOB: 1996-03-06 MRN: [id number]   Date of Service  05/05/2021  HPI/Events of Note  Called by radiology that CXR reveals a large R tension pneumothorax.   eICU Interventions  Plan: Will notify PCCM ground team of need for R chest tube.      Intervention Category Major Interventions: Other:  Lenell Antu 05/05/2021, 10:20 PM

## 2021-05-05 NOTE — Progress Notes (Addendum)
[place] Progress Note Patient Name: Mathew Cooke DOB: 09-11-1995 MRN: [id number]   Date of Service  05/05/2021  HPI/Events of Note  CXR reviewed.  eICU Interventions  No intervention. Will ask PCCM ground to pass on to the daytime attending that patient's ET tube cuff needs to be inspected for integrity (RT has had to periodically add air to the cuff). I also asked the bedside RN Ubaldo Glassing to pass this on to the PCCM daytime attending physician.        Frederik Pear 05/05/2021, 6:33 AM

## 2021-05-05 NOTE — Procedures (Signed)
Insertion of Chest Tube Procedure Note  Mathew Cooke  [id number]  12-Feb-1996  Date:05/05/21  Time:10:52 PM   Provider Performing: Mathew Cooke Mathew Cooke   Procedure: Chest Tube Insertion ([id number])  Indication(s) Pneumothorax  Consent Unable to obtain consent due to inability to find a medical decision maker for patient.  All reasonable efforts were made.  Another independent medical provider, Karl Luke, MD , confirmed the benefits of this procedure outweigh the risks.  Anesthesia Topical only with 1% lidocaine   Time Out Verified patient identification, verified procedure, site/side was marked, verified correct patient position, special equipment/implants available, medications/allergies/relevant history reviewed, required imaging and test results available.  Sterile Technique Maximal sterile technique including full sterile barrier drape, hand hygiene, sterile gown, sterile gloves, mask, hair covering, sterile ultrasound probe cover (if used).  Procedure Description Ultrasound not used to identify appropriate pleural anatomy for placement and overlying skin marked. Area of placement cleaned and draped in sterile fashion.  A 28FR French chest tube was placed into the right pleural space using Kelly dissection. Appropriate return of air was obtained.  The tube was connected to atrium and placed on -20 cm H2O wall suction.  Complications/Tolerance None; patient tolerated the procedure well. Chest X-ray is ordered to verify placement.  EBL Minimal  Specimen(s) none   Garner Nash, DO [facility] 05/05/2021 10:54 PM

## 2021-05-05 NOTE — Progress Notes (Signed)
[facility]M Evening Rounds  Throughout the day the patient has had worsening oxygenation and difficulty with sedation.  We switched to ketamine but this has not been helpful so we added back propofol.  Hopefully we can wean this off overnight.  I performed a bronchoscopy because of his hypoxemia.  I encountered bleeding from the necrotic left lower lobe.  I believe that when we turned him with his right side down (in attempt to improve oxygenation) the bleeding from his left lung spilled into his airway and formed the blood clot.  His oxygenation improved from 81% to 88% with the bronchoscopy.   Will position him with the left lung down and give TXA nebs.  If there is evidence of ongoing bleeding we will need to consider IR embolization of the left lower lobe, but that would be a high risk procedure so will need to attempt conservative interventions first.  I explained to his mother that his condition is critical and risk of death is high.  She voiced understanding about this and the plan of care.  Cc time this evening by me is 45 minutes  Heber [place], MD [facility] Pager: [phone] Cell: [phone] After 7:00 pm call Elink  [phone]

## 2021-05-05 NOTE — Progress Notes (Signed)
ET Tube changed successfully. Pt hyper oxygenated with 100%. Tube secured with commercial tube holder.

## 2021-05-05 NOTE — Progress Notes (Signed)
eLink Physician-Brief Progress Note Patient Name: Mathew Cooke DOB: July 20, 1995 MRN: [id number]   Date of Service  05/05/2021  HPI/Events of Note  ABG result reviewed.  eICU Interventions  Respiratory rate increased to 30, FIO2 reduced to 0.80, ABG to be checked at 7:15 AM.        Thomasene Lot Imane Burrough 05/05/2021, 6:12 AM

## 2021-05-05 NOTE — Procedures (Signed)
Intubation Procedure Note  Hobie Kohles  [id number]  26-Aug-1995  Date:05/05/21  Time:8:57 AM   Provider Performing:Brent Carmella Kees    Procedure: Intubation ([id number])  Indication(s) Respiratory Failure, persistent cuff leak  Consent Unable to obtain consent due to emergent nature of procedure.   Anesthesia Propofol infusion, versed infusion, dilaudid infusion, nimbex infusion   Time Out Verified patient identification, verified procedure, site/side was marked, verified correct patient position, special equipment/implants available, medications/allergies/relevant history reviewed, required imaging and test results available.   Sterile Technique Usual hand hygeine, masks, and gloves were used   Procedure Description Patient positioned in bed supine.  Sedation given as noted above.  Patient was intubated with endotracheal tube using  MAC 4 .  View was Grade 1 full glottis .  Number of attempts was 1.  The old ETT was visualized with the balloon cuff in the pharynx.  We placed a tube exchanger and advanced the ETT and re-inflated the cuff.  There was still a cuff leak.  So at this point I performed direct laryngoscopy again with the MAC 4, then we re-inserted the tube exchanger and removed the old ETT and placed an 8.0 ETT.  Placement was confirmed with direct laryngoscopy and normal wave forms on the ventilator and CXR.   Complications/Tolerance None; patient tolerated the procedure well. Chest X-ray was performed to verify placement.   EBL Trace amount noted on the end of the tube exchanger   Specimen(s) None   Roselie Awkward, MD  PCCM Pager: [phone] Cell: [phone] After 7:00 pm call Elink  [phone]

## 2021-05-05 NOTE — Progress Notes (Signed)
eLink Physician-Brief Progress Note Patient Name: Mathew Cooke DOB: [birth year] MRN: [id number]   Date of Service  05/05/2021  HPI/Events of Note  Hypoxia - Progressive hypoxia throughout the day. Felt to be d/t progression of pneumonia/Lung Injury and +/- hemorrhage into airway from necrotic lung. PCCM ground team currently at bedside addressing issues.   eICU Interventions  Plan: Portable CXR STAT.     Intervention Category Major Interventions: Hypoxemia - evaluation and management  Lenell Antu 05/05/2021, 9:32 PM

## 2021-05-05 NOTE — Progress Notes (Signed)
NAME:  Mathew Cooke, MRN:  [id number], DOB:  14-Aug-1995, LOS: 10 ADMISSION DATE:  04/05/2021, CONSULTATION DATE:  04/21/2021 REFERRING MD:  Alison Murray, CHIEF COMPLAINT:  dyspnea   History of Present Illness:  26 y o M with history of schizoaffective disorder, prior suicide attempt who was BIBEMS from home for dyspnea which had worsened over 2-3d PTA. Nausea, several episodes of emesis today. Left sided pleuritic CP. EMS gave him a liter of fluid.  In ED he was found to have necrotizing pneumonia and was given 1L LR, vanc, ceftriaxone and placed on nasal cannula which was escalated to BiPAP which he failed requiring endotracheal intubation.   Pertinent  Medical History  Schizoaffective disorder, prior suicide attempt  Significant Hospital Events: Including procedures, antibiotic start and stop dates in addition to other pertinent events   12/25 admitted, ETT placed. CT chest with extensive cavitary opacity c/f necrotizing PNA, adjacent air fluid levels in L pleural space. Started on veletri. Vec gtt. ECMO consult >not indicated at time of consult. NE, neo, vaso, solucortef  12/26 off vec gtt. Off neo  12/27 weaning veletri to off Dc solucortef> echo w/ septal motion inferior hypokinesis but no LV WMA. RV function mildly reduced. 12/28 weaning peep/fio2, changing RASS goal to -3. Off pressors. Abx narrowed. Vanc/ceftriaxone and flagyl stopped. Started on Unasyn and azith resumed until U legionella back.   12/29 Vecuronium infusion started, fever 101.8 12/31 Vent - 60%, PEEP 14, P/f 155, pPlat 28, DP 14. Decompensated later in the day, bronched. Proned, back on EPO. 1/1 bronch  1/2 did not tolerate proning  1/3 1 PRBC. Weaning epo. Air added to ETT cuff overnight multiple times  1/4 tube exchanged. Incr epo for hypoxia   Interim History / Subjective:  Overnight had to add air to ETT cuff multiple times Tube exchanged this morning Increasing epo dose due to hypoxia  Objective   Blood  pressure (!) 143/69, pulse (!) 124, temperature 98.8 F (37.1 C), temperature source Core, resp. rate (!) 30, height 5\' 7"  (1.702 m), weight 73.1 kg, SpO2 (!) 80 %.    Vent Mode: PRVC FiO2 (%):  [70 %-100 %] 100 % Set Rate:  [25 bmp-30 bmp] 30 bmp Vt Set:  [420 mL] 420 mL PEEP:  [14 cmH20] 14 cmH20 Plateau Pressure:  [22 cmH20-37 cmH20] 22 cmH20   Intake/Output Summary (Last 24 hours) at 05/05/2021 1151 Last data filed at 05/05/2021 1100 Gross per 24 hour  Intake 5308.53 ml  Output 4270 ml  Net 1038.53 ml   Filed Weights   05/03/21 0358 05/04/21 0500 05/05/21 0447  Weight: 72 kg 74.7 kg 73.1 kg    Examination: General: Critically ill young adult M intubated sedated chemically paralyzed  HEENT: ETT secure. Anicteric sclera. NCAT Neuro:  RASS -5. Chemically paralyzed. PERRL   CV: tachycardic. S1s2 bounding pulses  PULM: bilateral rhonchi. Symmetrical chest expansion. Mechanically ventilated  GI: soft flat + bowel sounds. FMS Extremities: No acute joint deformity no cyanosis or clubbing. Non-pitting edema  Skin: c/d/w. Scattered healed tattoos   Resolved Hospital Problem list   Septic shock resolved as of 12/28 Shock liver resolved as of 12/27  Lactic Acidosis  AKI Hyperphosphatemia   Assessment & Plan:   Acute hypoxic respiratory failure with ARDS due to necrotizing PNA, likely aspiration PNA L pleural effusion  -1/3 ABG 7.45/59.6/240/44/16/42  -- bit of a metabolic alkalosis  P -tube exchange 1/4  -cont lung protective ventilation  -nimbex, RASS -5  -Adding ketamine 1/4 (  on prop, dilaudid, versed gtt, enteral Oxy and clonazepam)  -Epo back up to 50 05/05/21 -linezolid, mero, eraxis  -cont diuresis -- (1/3 gave diamox and lasix) 1/4 40mg  x2  -completing 3d pulse dose steroids 1/4 -notably has decompensated previously with proning -- if this were to become indicated again, consider bronch before proning.  -anticipate will likely need trach   Severe sepsis due to  necrotizing Pna with multisystem organ dysfunction (AKI, Acute liver injury, respiratory failure, cardiomyopathy)-- improved shock  P -cont broad antimicrobials -- linezolid, mero, eraxis  - off NE 1/3. 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  HTN with tachycardia  -1/4 -- acutely onset. Associated tube exchange, hypoxia.  -suspect component of agitation driving this hemodynamic change P -adding ketamine  -ICU monitoring   Anemia of critical illness  Baseline Hgb ~12-13 1 PRBC 1/3 -- no obvious bleeding  P -hgb goal > 7   Hypokalemia -due to aggressive diuresis P -replacing -cont to trend  Hyperglycemia  Due to critical illness, steroids, enteral nutrition P -SSI -- goal 140-180  Hx schizoaffective disorder  -holding home meds   Tobacco abuse THC use Vapine  -We will counsel on the importance of avoiding these when appropriate  Best Practice (right click and "Reselect all SmartList Selections" daily)  Diet/type: tubefeeds DVT prophylaxis: LMWH GI prophylaxis: Protonix Lines: Yes needed Foley: Yes needed Code Status:  full code Last date of multidisciplinary goals of care discussion: 1/1 both parents updated, continue aggressive care   CRITICAL CARE Performed by:   Total critical care time: 58 minutes  Critical care time was exclusive of separately billable procedures and treating other patients. Critical care was necessary to treat or prevent imminent or life-threatening deterioration.  Critical care was time spent personally by me on the following activities: development of treatment plan with patient and/or surrogate as well as nursing, discussions with consultants, evaluation of patient's response to treatment, examination  of patient, obtaining history from patient or surrogate, ordering and performing treatments and interventions, ordering and review of laboratory studies, ordering and review of radiographic studies, pulse oximetry and re-evaluation of patient's condition.  MSN, AGACNP-BC [facility] Amion for pager  05/05/2021, 11:51 AM

## 2021-05-05 NOTE — Plan of Care (Signed)

## 2021-05-06 ENCOUNTER — Inpatient Hospital Stay (HOSPITAL_COMMUNITY): Payer: Medicare Other

## 2021-05-06 DIAGNOSIS — R6520 Severe sepsis without septic shock: Secondary | ICD-10-CM

## 2021-05-06 DIAGNOSIS — R6521 Severe sepsis with septic shock: Secondary | ICD-10-CM | POA: Diagnosis not present

## 2021-05-06 DIAGNOSIS — I5021 Acute systolic (congestive) heart failure: Secondary | ICD-10-CM

## 2021-05-06 DIAGNOSIS — J850 Gangrene and necrosis of lung: Secondary | ICD-10-CM

## 2021-05-06 DIAGNOSIS — R739 Hyperglycemia, unspecified: Secondary | ICD-10-CM | POA: Diagnosis not present

## 2021-05-06 DIAGNOSIS — J984 Other disorders of lung: Secondary | ICD-10-CM

## 2021-05-06 DIAGNOSIS — J930 Spontaneous tension pneumothorax: Secondary | ICD-10-CM

## 2021-05-06 DIAGNOSIS — I10 Essential (primary) hypertension: Secondary | ICD-10-CM

## 2021-05-06 DIAGNOSIS — J80 Acute respiratory distress syndrome: Secondary | ICD-10-CM | POA: Diagnosis not present

## 2021-05-06 DIAGNOSIS — A419 Sepsis, unspecified organism: Secondary | ICD-10-CM | POA: Diagnosis not present

## 2021-05-06 LAB — TRIGLYCERIDES: Triglycerides: 410 mg/dL — ABNORMAL HIGH (ref <150)

## 2021-05-06 LAB — COMPREHENSIVE METABOLIC PANEL
ALT: 40 U/L (ref 0–44)
AST: 38 U/L (ref 15–41)
Albumin: 1.5 g/dL — ABNORMAL LOW (ref 3.5–5.0)
Alkaline Phosphatase: 135 U/L — ABNORMAL HIGH (ref 38–126)
Anion gap: 3 — ABNORMAL LOW (ref 5–15)
BUN: 31 mg/dL — ABNORMAL HIGH (ref 6–20)
CO2: 36 mmol/L — ABNORMAL HIGH (ref 22–32)
Calcium: 7.9 mg/dL — ABNORMAL LOW (ref 8.9–10.3)
Chloride: 103 mmol/L (ref 98–111)
Creatinine, Ser: 0.69 mg/dL (ref 0.61–1.24)
GFR, Estimated: >60 mL/min (ref >60)
Glucose, Bld: 189 mg/dL — ABNORMAL HIGH (ref 70–99)
Potassium: 3.7 mmol/L (ref 3.5–5.1)
Sodium: 142 mmol/L (ref 135–145)
Total Bilirubin: 0.7 mg/dL (ref 0.3–1.2)
Total Protein: 4.8 g/dL — ABNORMAL LOW (ref 6.5–8.1)

## 2021-05-06 LAB — POCT I-STAT 7, (LYTES, BLD GAS, ICA,H+H)
Acid-Base Excess: 15 mmol/L — ABNORMAL HIGH (ref 0.0–2.0)
Acid-Base Excess: 14 mmol/L — ABNORMAL HIGH (ref 0.0–2.0)
Acid-Base Excess: 14 mmol/L — ABNORMAL HIGH (ref 0.0–2.0)
Bicarbonate: 41.2 mmol/L — ABNORMAL HIGH (ref 20.0–28.0)
Bicarbonate: 41.2 mmol/L — ABNORMAL HIGH (ref 20.0–28.0)
Bicarbonate: 40 mmol/L — ABNORMAL HIGH (ref 20.0–28.0)
Calcium, Ion: 1.2 mmol/L (ref 1.15–1.40)
Calcium, Ion: 1.15 mmol/L (ref 1.15–1.40)
Calcium, Ion: 1.17 mmol/L (ref 1.15–1.40)
HCT: 23 % — ABNORMAL LOW (ref 39.0–52.0)
HCT: 25 % — ABNORMAL LOW (ref 39.0–52.0)
HCT: 23 % — ABNORMAL LOW (ref 39.0–52.0)
Hemoglobin: 7.8 g/dL — ABNORMAL LOW (ref 13.0–17.0)
Hemoglobin: 8.5 g/dL — ABNORMAL LOW (ref 13.0–17.0)
Hemoglobin: 7.8 g/dL — ABNORMAL LOW (ref 13.0–17.0)
O2 Saturation: 100 %
O2 Saturation: 100 %
O2 Saturation: 99 %
Patient temperature: 37.8
Patient temperature: 38.2
Patient temperature: 39.1
Potassium: 3.6 mmol/L (ref 3.5–5.1)
Potassium: 3.6 mmol/L (ref 3.5–5.1)
Potassium: 4 mmol/L (ref 3.5–5.1)
Sodium: 147 mmol/L — ABNORMAL HIGH (ref 135–145)
Sodium: 149 mmol/L — ABNORMAL HIGH (ref 135–145)
Sodium: 149 mmol/L — ABNORMAL HIGH (ref 135–145)
TCO2: 43 mmol/L — ABNORMAL HIGH (ref 22–32)
TCO2: 43 mmol/L — ABNORMAL HIGH (ref 22–32)
TCO2: 42 mmol/L — ABNORMAL HIGH (ref 22–32)
pCO2 arterial: 68.1 mmHg (ref 32.0–48.0)
pCO2 arterial: 72.4 mmHg (ref 32.0–48.0)
pCO2 arterial: 67.8 mmHg (ref 32.0–48.0)
pH, Arterial: 7.393 (ref 7.350–7.450)
pH, Arterial: 7.368 (ref 7.350–7.450)
pH, Arterial: 7.388 (ref 7.350–7.450)
pO2, Arterial: 209 mmHg — ABNORMAL HIGH (ref 83.0–108.0)
pO2, Arterial: 198 mmHg — ABNORMAL HIGH (ref 83.0–108.0)
pO2, Arterial: 182 mmHg — ABNORMAL HIGH (ref 83.0–108.0)

## 2021-05-06 LAB — HEMOGLOBIN AND HEMATOCRIT, BLOOD
HCT: 25 % — ABNORMAL LOW (ref 39.0–52.0)
Hemoglobin: 7.9 g/dL — ABNORMAL LOW (ref 13.0–17.0)

## 2021-05-06 LAB — MAGNESIUM: Magnesium: 2.5 mg/dL — ABNORMAL HIGH (ref 1.7–2.4)

## 2021-05-06 LAB — CBC
HCT: 21.8 % — ABNORMAL LOW (ref 39.0–52.0)
Hemoglobin: 6.8 g/dL — CL (ref 13.0–17.0)
MCH: 31.6 pg (ref 26.0–34.0)
MCHC: 31.2 g/dL (ref 30.0–36.0)
MCV: 101.4 fL — ABNORMAL HIGH (ref 80.0–100.0)
Platelets: 364 10*3/uL (ref 150–400)
RBC: 2.15 MIL/uL — ABNORMAL LOW (ref 4.22–5.81)
RDW: 14.7 % (ref 11.5–15.5)
WBC: 32.3 10*3/uL — ABNORMAL HIGH (ref 4.0–10.5)
nRBC: 2.4 % — ABNORMAL HIGH (ref 0.0–0.2)

## 2021-05-06 LAB — GLUCOSE, CAPILLARY
Glucose-Capillary: 179 mg/dL — ABNORMAL HIGH (ref 70–99)
Glucose-Capillary: 136 mg/dL — ABNORMAL HIGH (ref 70–99)
Glucose-Capillary: 130 mg/dL — ABNORMAL HIGH (ref 70–99)
Glucose-Capillary: 108 mg/dL — ABNORMAL HIGH (ref 70–99)
Glucose-Capillary: 127 mg/dL — ABNORMAL HIGH (ref 70–99)
Glucose-Capillary: 163 mg/dL — ABNORMAL HIGH (ref 70–99)

## 2021-05-06 LAB — PREPARE RBC (CROSSMATCH)

## 2021-05-06 MED ORDER — POTASSIUM CHLORIDE 20 MEQ PO PACK
40.0000 meq | PACK | ORAL | Status: DC
  Administered 2021-05-06: 40 meq
  Filled 2021-05-06: qty 2

## 2021-05-06 MED ORDER — METHADONE HCL 10 MG/ML PO CONC
5.0000 mg | Freq: Three times a day (TID) | ORAL | Status: DC
  Administered 2021-05-06 – 2021-05-08 (×6): 5 mg
  Filled 2021-05-06 (×6): qty 5

## 2021-05-06 MED ORDER — FUROSEMIDE 10 MG/ML IJ SOLN
40.0000 mg | Freq: Four times a day (QID) | INTRAMUSCULAR | Status: AC
  Administered 2021-05-06 (×3): 40 mg via INTRAVENOUS
  Filled 2021-05-06 (×3): qty 4

## 2021-05-06 MED ORDER — SODIUM CHLORIDE 0.9% IV SOLUTION: Freq: Once | INTRAVENOUS | Status: AC

## 2021-05-06 MED ORDER — POTASSIUM CHLORIDE 20 MEQ PO PACK
40.0000 meq | PACK | ORAL | Status: AC
  Administered 2021-05-06 (×2): 40 meq
  Filled 2021-05-06 (×2): qty 2

## 2021-05-06 NOTE — Progress Notes (Signed)
RT to hold CPT for rest of night due to placement of chest tube on right side. RT will continue to monitor as needed.

## 2021-05-06 NOTE — Plan of Care (Signed)

## 2021-05-06 NOTE — Progress Notes (Addendum)
eLink Physician-Brief Progress Note Patient Name: Mathew Cooke DOB: [birth year] MRN: [id number]   Date of Service  05/06/2021  HPI/Events of Note  Review of portable CXR reveals: Slight increase in size of right pneumothorax since yesterday. This remains small, measuring 15 mm at the lateral lower lung zone and small apical component. Right chest tube remains in place.  eICU Interventions  Plan: Increase pleuravac suction to -40. Repeat CXR at 5 AM. Will relay findings to PCCM ground team.      Intervention Category Major Interventions: Hypoxemia - evaluation and management  Mathew Cooke [name] 05/06/2021, 11:09 PM

## 2021-05-06 NOTE — Progress Notes (Signed)
eLink Physician-Brief Progress Note Patient Name: Mathew Cooke DOB: 06/07/1995 MRN: [id number]   Date of Service  05/06/2021  HPI/Events of Note  Anemia - Hgb = 6.8.  eICU Interventions  Will transfuse 1 unit PRBC now.      Intervention Category Major Interventions: Other:  Lenell Antu 05/06/2021, 4:17 AM

## 2021-05-06 NOTE — Progress Notes (Signed)
Assisted tele visit to patient with family member. Attempted to connect with family member multiple times with no success.   Kerin Perna, RN

## 2021-05-06 NOTE — Progress Notes (Signed)
[facility] Progress Note Patient Name: Mathew Cooke DOB: 29-Jun-1995 MRN: [id number]   Date of Service  05/06/2021  HPI/Events of Note  Hypoxia - Sats decreased to 75%. Patient placed with L side dependent and sats have improved to 86%.  eICU Interventions  Plan: Portable CXR STAT to r/o recurrence of R pneumothorax. Will request PCCM ground team evaluate the patient at bedside.     Intervention Category Major Interventions: Hypoxemia - evaluation and management  Reneta Niehaus [place] 05/06/2021, 10:22 PM

## 2021-05-06 NOTE — Progress Notes (Signed)
NAME:  Mathew Cooke, MRN:  [id number], DOB:  01-30-1996, LOS: 11 ADMISSION DATE:  May 01, 2021, CONSULTATION DATE:  05/01/2021 REFERRING MD:  Alison Murray, CHIEF COMPLAINT:  dyspnea   History of Present Illness:  25 y o M with history of schizoaffective disorder, prior suicide attempt who was BIBEMS from home for dyspnea which had worsened over 2-3d PTA. Nausea, several episodes of emesis today. Left sided pleuritic CP. EMS gave him a liter of fluid.  In ED he was found to have necrotizing pneumonia and was given 1L LR, vanc, ceftriaxone and placed on nasal cannula which was escalated to BiPAP which he failed requiring endotracheal intubation.   Pertinent  Medical History  Schizoaffective disorder, prior suicide attempt  Significant Hospital Events: Including procedures, antibiotic start and stop dates in addition to other pertinent events   12/25 admitted, ETT placed. CT chest with extensive cavitary opacity c/f necrotizing PNA, adjacent air fluid levels in L pleural space. Started on veletri. Vec gtt. ECMO consult >not indicated at time of consult. NE, neo, vaso, solucortef  12/26 off vec gtt. Off neo  12/27 weaning veletri to off Dc solucortef> echo w/ septal motion inferior hypokinesis but no LV WMA. RV function mildly reduced. 12/28 weaning peep/fio2, changing RASS goal to -3. Off pressors. Abx narrowed. Vanc/ceftriaxone and flagyl stopped. Started on Unasyn and azith resumed until U legionella back.   12/29 Vecuronium infusion started, fever 101.8 12/31 Vent - 60%, PEEP 14, P/f 155, pPlat 28, DP 14. Decompensated later in the day, bronched. Proned, back on EPO. 1/1 bronch  1/2 did not tolerate proning  1/3 1 PRBC. Weaning epo. Air added to ETT cuff overnight multiple times  1/4 tube exchanged. Incr epo for hypoxia. Bronch, found bleeding from LLL. Night shift R tension ptx, got a surgical chest tube  1/5 1 PRBC for hgb 6.8. Wean PEEP to 14 for high peaks   Interim History / Subjective:   Tension ptx overnight, R sided chest tube placed 1 PRBC this morning for hgb 6.8  Objective   Blood pressure 108/62, pulse 97, temperature (!) 100.6 F (38.1 C), temperature source Core, resp. rate (!) 30, height 5\' 7"  (1.702 m), weight 75.1 kg, SpO2 97 %.    Vent Mode: PRVC FiO2 (%):  [100 %] 100 % Set Rate:  [30 bmp] 30 bmp Vt Set:  [420 mL] 420 mL PEEP:  [14 cmH20-16 cmH20] 14 cmH20 Plateau Pressure:  [22 cmH20-35 cmH20] 32 cmH20   Intake/Output Summary (Last 24 hours) at 05/06/2021 1010 Last data filed at 05/06/2021 1000 Gross per 24 hour  Intake 4853.86 ml  Output 3030 ml  Net 1823.86 ml   Filed Weights   05/04/21 0500 05/05/21 0447 05/06/21 0323  Weight: 74.7 kg 73.1 kg 75.1 kg    Examination: General: Critically ill young adult M intubated sedated paralyzed  HEENT: ETT secure. Anicteric sclera. NCAT Neuro:  Chemically paralyzed and sedated. PERRL.  CV: rrr s1s2 cap refill < 3  PULM: Mechanically ventilated. R chest tube. Coarse breath sounds GI: flat soft + bowel sounds  Extremities: No acute joint deformity. No cyanosis or clubbing. Non-pitting extremity edema BUE > BLE  Skin: c/d/w. No rash. Healed tattoos, scattered   Resolved Hospital Problem list   Septic shock resolved as of 12/28 Shock liver resolved as of 12/27  Lactic Acidosis  AKI Hyperphosphatemia   Assessment & Plan:    Acute hypoxic respiratory failure with ARDS due to LLL necrotizing PNA Likely aspiration PNA L pleural effusion  LLL bleeding R tension PTX s/p chest tube  Need for sedation for mechanical ventilation due to ARDS  -tube exchange 1/4, bronch later in day and found bleeding from LLL which had spilled into R lung when R lung positioned down. Later with R sided tension ptx  -peaks 31 1/5. Over oxygenated -- PaO2 209. ABG 7.3/68/209/43 -at risk for chemical pneumonitis 2/2 bleeding  P -incr ceiling of ketamine, versed, dilaudid. Cont enteral oxy and clonzepam. Goal to wean off prop.   -Continues on nimbex  -decr PEEP 14. Recheck ABG in 1 hr. If peaks still high, can consider going from 7cc Vt to 6cc. If peaks improved and still over oxygenating can decr epo  -Epo at 50  -linezolid, mero, eraxis  -routine chest tube care  -AM CXR and ABG (+ PRN) -cont diuresis (eval daily)   Severe sepsis due to necrotizing PNA with multisystem organ dysfunction  (AKI, Acute liver injury, respiratory failure, cardiomyopathy)-- improved shock  P -cont broad antimicrobials -- linezolid, mero, eraxis  - off NE 1/3. Available if needed for MAP > 65   Acute HFrEF -likely septic cardiomyopathy  -LVEF 45-50% P -Continue telemetry monitoring -Strict I/O -Will eventually need follow-up echo to ensure recovery of ejection fraction.  Anemia, critical illness, acute blood loss with LLL lung bleeding  Baseline Hgb ~12-13 1 PRBC 1/3 1 PRBC 1/5 P -trend CBC -goal hgb >7  Hypokalemia -due to aggressive diuresis P -replace, trend   Hyperglycemia  Due to critical illness, steroids, enteral nutrition P -SSI -- goal 140-180  Hx Schizoaffective  -holding home meds   Tobacco abuse THC abuse Vaping P -We will counsel on the importance of avoiding these when appropriate  Best Practice (right click and "Reselect all SmartList Selections" daily)  Diet/type: tubefeeds DVT prophylaxis: LMWH GI prophylaxis: Protonix Lines: Yes needed Foley: Yes needed Code Status:  full code Last date of multidisciplinary goals of care discussion: 1/4     CRITICAL CARE Performed by: Lanier Clam  Total critical care time: 48 minutes  Critical care time was exclusive of separately billable procedures and treating other patients. Critical care was necessary to treat or prevent imminent or life-threatening deterioration.  Critical care was time spent personally by me on the following activities: development of treatment plan with patient and/or surrogate as well as nursing, discussions with  consultants, evaluation of patient's response to treatment, examination of patient, obtaining history from patient or surrogate, ordering and performing treatments and interventions, ordering and review of laboratory studies, ordering and review of radiographic studies, pulse oximetry and re-evaluation of patient's condition.   Tessie Fass MSN, AGACNP-BC [facility] Pulmonary/Critical Care Medicine Amion for pager 05/06/2021, 10:10 AM

## 2021-05-07 ENCOUNTER — Inpatient Hospital Stay (HOSPITAL_COMMUNITY): Payer: Medicare Other

## 2021-05-07 DIAGNOSIS — J9601 Acute respiratory failure with hypoxia: Secondary | ICD-10-CM | POA: Diagnosis not present

## 2021-05-07 LAB — TYPE AND SCREEN
ABO/RH(D): O POS
Antibody Screen: NEGATIVE
Unit division: 0
Unit division: 0

## 2021-05-07 LAB — GLUCOSE, CAPILLARY
Glucose-Capillary: 152 mg/dL — ABNORMAL HIGH (ref 70–99)
Glucose-Capillary: 132 mg/dL — ABNORMAL HIGH (ref 70–99)
Glucose-Capillary: 130 mg/dL — ABNORMAL HIGH (ref 70–99)
Glucose-Capillary: 128 mg/dL — ABNORMAL HIGH (ref 70–99)
Glucose-Capillary: 130 mg/dL — ABNORMAL HIGH (ref 70–99)

## 2021-05-07 LAB — POCT I-STAT 7, (LYTES, BLD GAS, ICA,H+H)
Acid-Base Excess: 14 mmol/L — ABNORMAL HIGH (ref 0.0–2.0)
Acid-Base Excess: 16 mmol/L — ABNORMAL HIGH (ref 0.0–2.0)
Bicarbonate: 42.2 mmol/L — ABNORMAL HIGH (ref 20.0–28.0)
Bicarbonate: 45.2 mmol/L — ABNORMAL HIGH (ref 20.0–28.0)
Calcium, Ion: 1.2 mmol/L (ref 1.15–1.40)
Calcium, Ion: 1.26 mmol/L (ref 1.15–1.40)
HCT: 25 % — ABNORMAL LOW (ref 39.0–52.0)
HCT: 29 % — ABNORMAL LOW (ref 39.0–52.0)
Hemoglobin: 8.5 g/dL — ABNORMAL LOW (ref 13.0–17.0)
Hemoglobin: 9.9 g/dL — ABNORMAL LOW (ref 13.0–17.0)
O2 Saturation: 97 %
O2 Saturation: 84 %
Patient temperature: 98.4
Patient temperature: 99.1
Potassium: 3.6 mmol/L (ref 3.5–5.1)
Potassium: 4 mmol/L (ref 3.5–5.1)
Sodium: 150 mmol/L — ABNORMAL HIGH (ref 135–145)
Sodium: 150 mmol/L — ABNORMAL HIGH (ref 135–145)
TCO2: 45 mmol/L — ABNORMAL HIGH (ref 22–32)
TCO2: 48 mmol/L — ABNORMAL HIGH (ref 22–32)
pCO2 arterial: 77.3 mmHg (ref 32.0–48.0)
pCO2 arterial: 88.7 mmHg (ref 32.0–48.0)
pH, Arterial: 7.345 — ABNORMAL LOW (ref 7.350–7.450)
pH, Arterial: 7.317 — ABNORMAL LOW (ref 7.350–7.450)
pO2, Arterial: 96 mmHg (ref 83.0–108.0)
pO2, Arterial: 58 mmHg — ABNORMAL LOW (ref 83.0–108.0)

## 2021-05-07 LAB — BPAM RBC
Blood Product Expiration Date: 202301172359
Blood Product Expiration Date: 202301212359
ISSUE DATE / TIME: 202301030618
ISSUE DATE / TIME: 202301050432
Unit Type and Rh: 5100
Unit Type and Rh: 5100

## 2021-05-07 LAB — COMPREHENSIVE METABOLIC PANEL
ALT: 52 U/L — ABNORMAL HIGH (ref 0–44)
AST: 39 U/L (ref 15–41)
Albumin: 1.6 g/dL — ABNORMAL LOW (ref 3.5–5.0)
Alkaline Phosphatase: 138 U/L — ABNORMAL HIGH (ref 38–126)
Anion gap: 5 (ref 5–15)
BUN: 23 mg/dL — ABNORMAL HIGH (ref 6–20)
CO2: 38 mmol/L — ABNORMAL HIGH (ref 22–32)
Calcium: 7.7 mg/dL — ABNORMAL LOW (ref 8.9–10.3)
Chloride: 106 mmol/L (ref 98–111)
Creatinine, Ser: 0.57 mg/dL — ABNORMAL LOW (ref 0.61–1.24)
GFR, Estimated: >60 mL/min (ref >60)
Glucose, Bld: 128 mg/dL — ABNORMAL HIGH (ref 70–99)
Potassium: 3.6 mmol/L (ref 3.5–5.1)
Sodium: 149 mmol/L — ABNORMAL HIGH (ref 135–145)
Total Bilirubin: 0.8 mg/dL (ref 0.3–1.2)
Total Protein: 4.9 g/dL — ABNORMAL LOW (ref 6.5–8.1)

## 2021-05-07 LAB — CBC
HCT: 26.5 % — ABNORMAL LOW (ref 39.0–52.0)
Hemoglobin: 8.3 g/dL — ABNORMAL LOW (ref 13.0–17.0)
MCH: 31.7 pg (ref 26.0–34.0)
MCHC: 31.3 g/dL (ref 30.0–36.0)
MCV: 101.1 fL — ABNORMAL HIGH (ref 80.0–100.0)
Platelets: 300 10*3/uL (ref 150–400)
RBC: 2.62 MIL/uL — ABNORMAL LOW (ref 4.22–5.81)
RDW: 17.2 % — ABNORMAL HIGH (ref 11.5–15.5)
WBC: 35.8 10*3/uL — ABNORMAL HIGH (ref 4.0–10.5)
nRBC: 1.6 % — ABNORMAL HIGH (ref 0.0–0.2)

## 2021-05-07 LAB — TRIGLYCERIDES: Triglycerides: 307 mg/dL — ABNORMAL HIGH (ref <150)

## 2021-05-07 LAB — HEPATITIS A ANTIBODY, TOTAL: hep A Total Ab: REACTIVE — AB

## 2021-05-07 LAB — HEPATITIS B CORE ANTIBODY, TOTAL: Hep B Core Total Ab: NONREACTIVE

## 2021-05-07 LAB — HEPATITIS C ANTIBODY: HCV Ab: NONREACTIVE

## 2021-05-07 LAB — CRYPTOCOCCAL ANTIGEN: Crypto Ag: NEGATIVE

## 2021-05-07 LAB — HEPATITIS B SURFACE ANTIGEN: Hepatitis B Surface Ag: NONREACTIVE

## 2021-05-07 LAB — MAGNESIUM: Magnesium: 2.2 mg/dL (ref 1.7–2.4)

## 2021-05-07 MED ORDER — METOLAZONE 5 MG PO TABS
5.0000 mg | ORAL_TABLET | Freq: Once | ORAL | Status: AC
  Administered 2021-05-07: 5 mg
  Filled 2021-05-07: qty 1

## 2021-05-07 MED ORDER — SODIUM CHLORIDE 0.9 % IV SOLN
0.5000 mg/h | INTRAVENOUS | Status: DC
  Administered 2021-05-07: 10 mg/h via INTRAVENOUS
  Administered 2021-05-08: 16 mg/h via INTRAVENOUS
  Filled 2021-05-07 (×2): qty 20

## 2021-05-07 MED ORDER — POTASSIUM CHLORIDE 20 MEQ PO PACK
40.0000 meq | PACK | ORAL | Status: AC
  Administered 2021-05-07 (×3): 40 meq
  Filled 2021-05-07 (×3): qty 2

## 2021-05-07 MED ORDER — CLONAZEPAM 1 MG PO TABS
2.0000 mg | ORAL_TABLET | Freq: Three times a day (TID) | ORAL | Status: DC
  Administered 2021-05-07 – 2021-05-09 (×6): 2 mg
  Filled 2021-05-07 (×6): qty 2

## 2021-05-07 MED ORDER — VANCOMYCIN HCL 1750 MG/350ML IV SOLN
1750.0000 mg | Freq: Once | INTRAVENOUS | Status: AC
  Administered 2021-05-07: 1750 mg via INTRAVENOUS
  Filled 2021-05-07: qty 350

## 2021-05-07 MED ORDER — LIDOCAINE HCL (PF) 1 % IJ SOLN
INTRAMUSCULAR | Status: AC
  Filled 2021-05-07: qty 5

## 2021-05-07 MED ORDER — KETAMINE HCL 100 MG/ML IJ SOLN
3.0000 mg/kg/h | INTRAMUSCULAR | Status: DC
  Administered 2021-05-07 (×5): 3 mg/kg/h via INTRAVENOUS
  Filled 2021-05-07 (×8): qty 5

## 2021-05-07 MED ORDER — CISATRACURIUM BOLUS VIA INFUSION
8.0000 mg | Freq: Once | INTRAVENOUS | Status: AC
  Administered 2021-05-07: 8 mg via INTRAVENOUS
  Filled 2021-05-07: qty 8

## 2021-05-07 MED ORDER — VANCOMYCIN HCL 1500 MG/300ML IV SOLN
1500.0000 mg | Freq: Two times a day (BID) | INTRAVENOUS | Status: DC
  Administered 2021-05-07 – 2021-05-09 (×4): 1500 mg via INTRAVENOUS
  Filled 2021-05-07 (×5): qty 300

## 2021-05-07 MED ORDER — ACETAZOLAMIDE 250 MG PO TABS
250.0000 mg | ORAL_TABLET | Freq: Two times a day (BID) | ORAL | Status: AC
  Administered 2021-05-07 – 2021-05-08 (×3): 250 mg
  Filled 2021-05-07 (×3): qty 1

## 2021-05-07 MED ORDER — SODIUM CHLORIDE 0.9 % IV SOLN
0.5000 mg/h | INTRAVENOUS | Status: DC
  Administered 2021-05-08: 8 mg/h via INTRAVENOUS
  Filled 2021-05-07: qty 50

## 2021-05-07 MED ORDER — SODIUM CHLORIDE 0.9 % IV SOLN
3.0000 mg/kg/h | INTRAVENOUS | Status: DC
  Administered 2021-05-08: 3 mg/kg/h via INTRAVENOUS
  Filled 2021-05-07 (×3): qty 25

## 2021-05-07 MED ORDER — CISATRACURIUM BESYLATE 20 MG/10ML IV SOLN
0.1000 mg/kg | Freq: Once | INTRAVENOUS | Status: DC
  Filled 2021-05-07: qty 10

## 2021-05-07 MED ORDER — SODIUM CHLORIDE 0.9 % IV SOLN
3.0000 g | Freq: Four times a day (QID) | INTRAVENOUS | Status: DC
  Administered 2021-05-07 – 2021-05-09 (×9): 3 g via INTRAVENOUS
  Filled 2021-05-07 (×9): qty 8

## 2021-05-07 MED ORDER — MIDAZOLAM-SODIUM CHLORIDE 100-0.9 MG/100ML-% IV SOLN: 2.0000 mg/h | INTRAVENOUS | Status: DC

## 2021-05-07 MED ORDER — FUROSEMIDE 10 MG/ML IJ SOLN
60.0000 mg | Freq: Three times a day (TID) | INTRAMUSCULAR | Status: AC
  Administered 2021-05-07 – 2021-05-09 (×6): 60 mg via INTRAVENOUS
  Filled 2021-05-07 (×6): qty 6

## 2021-05-07 NOTE — Progress Notes (Signed)
NAME:  Mathew Cooke, MRN:  [id number], DOB:  1996-01-04, LOS: 12 ADMISSION DATE:  05-18-2021, CONSULTATION DATE:  05-18-2021 REFERRING MD:  Alison Murray, CHIEF COMPLAINT:  dyspnea   History of Present Illness:  26 y o M with history of schizoaffective disorder, prior suicide attempt who was BIBEMS from home for dyspnea which had worsened over 2-3d PTA. Nausea, several episodes of emesis today. Left sided pleuritic CP. EMS gave him a liter of fluid.  In ED he was found to have necrotizing pneumonia and was given 1L LR, vanc, ceftriaxone and placed on nasal cannula which was escalated to BiPAP which he failed requiring endotracheal intubation.   Pertinent  Medical History  Schizoaffective disorder, prior suicide attempt  Significant Hospital Events: Including procedures, antibiotic start and stop dates in addition to other pertinent events   12/25 admitted, ETT placed. CT chest with extensive cavitary opacity c/f necrotizing PNA, adjacent air fluid levels in L pleural space. Started on veletri. Vec gtt. ECMO consult >not indicated at time of consult. NE, neo, vaso, solucortef  12/26 off vec gtt. Off neo  12/27 weaning veletri to off Dc solucortef> echo w/ septal motion inferior hypokinesis but no LV WMA. RV function mildly reduced. 12/28 weaning peep/fio2, changing RASS goal to -3. Off pressors. Abx narrowed. Vanc/ceftriaxone and flagyl stopped. Started on Unasyn and azith resumed until U legionella back.   12/29 Vecuronium infusion started, fever 101.8 12/31 Vent - 60%, PEEP 14, P/f 155, pPlat 28, DP 14. Decompensated later in the day, bronched. Proned, back on EPO. 1/1 bronch  1/2 did not tolerate proning  1/3 1 PRBC. Weaning epo. Air added to ETT cuff overnight multiple times  1/4 tube exchanged. Incr epo for hypoxia. Bronch, found bleeding from LLL. Night shift R tension ptx, got a surgical chest tube  1/5 1 PRBC for hgb 6.8. Wean PEEP to 14 for high peaks   Interim History / Subjective:   Remains intubated/sedated/paralyzed DP 18 (30 plat 12 PEEP)  Objective   Blood pressure 108/62, pulse 100, temperature 97.8 F (36.6 C), resp. rate (!) 30, height 5\' 7"  (1.702 m), weight 80.5 kg, SpO2 93 %.    Vent Mode: PRVC FiO2 (%):  [80 %-100 %] 100 % Set Rate:  [30 bmp] 30 bmp Vt Set:  [420 mL] 420 mL PEEP:  [12 cmH20-14 cmH20] 12 cmH20 Plateau Pressure:  [26 cmH20-34 cmH20] 34 cmH20   Intake/Output Summary (Last 24 hours) at 05/07/2021 0843 Last data filed at 05/07/2021 0800 Gross per 24 hour  Intake 5309.12 ml  Output 6310 ml  Net -1000.88 ml    Filed Weights   05/05/21 0447 05/06/21 0323 05/07/21 0435  Weight: 73.1 kg 75.1 kg 80.5 kg    Examination: No distress on vent Paralyzed R chest tube with no tidaling or air leak   Resolved Hospital Problem list   Septic shock resolved as of 12/28 Shock liver resolved as of 12/27  Lactic Acidosis  AKI Hyperphosphatemia   Assessment & Plan:    Acute hypoxic respiratory failure with ARDS due to LLL necrotizing PNA- culture neg; LE duplex neg, ongoing and unresponsive to abx Likely aspiration PNA L pleural effusion LLL bleeding from necrosis R tension PTX s/p chest tube- near resolved on imaging Need for sedation for mechanical ventilation due to ARDS  Severe sepsis due to necrotizing PNA with multisystem organ dysfunction Septic cardiomyopathy ABLA presumed from LLL Hyperglycemia  Hx Schizoaffective  Tobacco abuse THC abuse Vaping  - PEEP/FiO2 titration balancing ARDS  protocol but keep PEEP < 12 to try to reduce risk of BPF formation on R - Bronch PRN bleeding - Wean veletri as able - Chest tube to suction - No further proning (no benefit, risk of worsening pulmonary hemorrhage and pneumothorax), okay to put left lung down - Trial off NMB today, high risk for neuromuscular atrophy - Sedation as ordered for now - Currently on eraxis, linezolid, meropenem, will discuss duration with pharmD - Grim  prognosis  Best Practice (right click and "Reselect all SmartList Selections" daily)  Diet/type: tubefeeds DVT prophylaxis: LMWH GI prophylaxis: Protonix Lines: Yes needed Foley: Yes needed Code Status:  full code Last date of multidisciplinary goals of care discussion: 1/4     Patient critically ill due to resp failure Interventions to address this today vent titration Risk of deterioration without these interventions is high  I personally spent 33 minutes providing critical care not including any separately billable procedures  Myrla Halsted MD [facility]  Prefer epic messenger for cross cover needs If after hours, please call E-link

## 2021-05-07 NOTE — Progress Notes (Signed)
eLink Physician-Brief Progress Note Patient Name: Mathew Cooke DOB: 1995-07-16 MRN: [id number]   Date of Service  05/07/2021  HPI/Events of Note  Patient desaturated but saturation is back up to 88 %.  eICU Interventions  Repeat CXR if persistent desaturation occurs.        Thomasene Lot Hydee Fleece 05/07/2021, 11:37 PM

## 2021-05-07 NOTE — Progress Notes (Signed)
PCCM:  Came to bedside to evaluate the chest tube. Tube not kinked and patent. At -40cm water. No air leak. Possibly sucking small amount of atmospheric air around tube insertion site. Sats are stable 90%.   Dressing replaced with vaseline gauze around tube site.  Repeat CXR in a few hours.  If PTX worse we will place another tube.   Josephine Igo, DO [facility] 05/07/2021 10:30 PM

## 2021-05-07 NOTE — Consult Note (Signed)
[place] for Infectious Disease    Date of Admission:  04/29/2021   Total days of inpatient antibiotics 14 days        Reason for Consult: Cavitary pneumonia    Principal Problem:   Acute hypoxemic respiratory failure (HCC) Active Problems:   ARDS (adult respiratory distress syndrome) (HCC)   Sepsis (HCC)   Assessment: 26 YM with IVDA, prior suicide attempt admitted for necrotizing pneumonia, now on Eraxis, meropenem and linezolid.   #Cavitary pneumonia and left lung empyema 2/2 suspected aspiration #Hx of IVDA -Pt underwent bronch on 1/1, 1/4->no Cx obtained -Right chest tube placed on  05/05/21 due to R tension pneumothorax -Tracheal aspirate on 12/312+MRSE which likely contaminant -He has received 2 weeks of board spectrum antibiotics thus far(Vanc+ ceftriaxone+ flagyl->unasyn azithro->eraxis, linezolid, meropenem) Etiology: - Suspect he likely aspirated leading to cavitary pneumonia. Hopefully chest tube will aid in clinical improvement. Would recommend narrowing antibiotic coverage as clinical decline was likely a source control issue. -As we don't have Cx data from BAL would recommend treating pt empirically. Unasyn for aspiration coverage, and add Vancomycin as he has history of IVDA(cover resistant gram positives) in the setting of cavitary pneumonia. Work up other possible  etiologies(fungal/tb) -TTE did not show valvular vegetations, Blood Cx have been negative. Given hx of IVDA, He could have had valvular vegetation which embolized to his lungs. Would get TEE.  -04/21/2021 HIV negative: Due to History of high risk behavior added Hep panel and RPR  Recommendations:  -D/C meropenem, linezolid and eraxis -Start Vancomycin and Unasyn -Consider repeat CT to evaluate for progression, possibly drain empyema -If pt is taken for bronch please obtain bacterial/fungal/afb Cx along with  Aspergillus Ag  -Ordered TEE -Ordered Glucomannan and fungitell serum, Urine  histo Ag, Urine blast Ag, Coccidioides Ag and  Cryptococcal  Ag serum.  -Ordered IGRA -Ordered Hep panel and RPR(Hx of IVDA andSTI exposure) -Monitor clinically  #Leukocytosis in the setting of steroids -Pt received solumedrol 1/1-1/4 Microbiology:   Antibiotics: Linezolid 12/30-p Ceftiraxone 12/24-12/27 Vanomcyin 12/24-12/27 Metronidazole 12/25-28 Azithormycin 12/25, 12/28-29 Eraxis 12/31-p Unasyn 12/28-12/31 Meropenem 12/31-p    Cultures: Blood 12/24 NGTD Urine 12/26 NO growth Other Tracheal aspirate 12/31 MRSE  HPI: Mathew Cooke is a 26 y.o. male with Hx of IVDA with heroin(documentation in chart (12/21)pt had been injecting heroin), schizoaffective disorder, prior suicide attempt admitted for acute respiratory failure. Found to have necrotizing pneumonia on CT, pt was intubated and started on vancomycin, ceftriaxone and metronidazole spectrum antibiotics. On 12/28 , pt off of pressors and doing well transitioned to unasyn and azithromycin. Decompensated on 12/31, and fevered the day prior. CT showed progressive cavitary pneumonia. Antibiotics changed to eraxis, meropenem and linezolid. He underwent bronch x2. Received chest tube due to R sided pneumothorax. ID engaged for antibiotic management.    Review of Systems: Review of Systems  Unable to perform ROS: Intubated   No past medical history on file.  Social History   Tobacco Use   Smoking status: Every Day    Packs/day: 1.00    Types: Cigarettes   Smokeless tobacco: Never  Substance Use Topics   Alcohol use: Not Currently    Comment: Ocassionally   Drug use: Yes    Types: Marijuana, Cocaine    Comment: heroin    No family history on file. Scheduled Meds:  artificial tears  1 application Both Eyes O1H   chlorhexidine gluconate (MEDLINE KIT)  15 mL Mouth Rinse BID  Chlorhexidine Gluconate Cloth  6 each Topical Daily   clonazePAM  2 mg Per Tube BID   docusate  100 mg Per Tube BID   enoxaparin  (LOVENOX) injection  40 mg Subcutaneous Q24H   feeding supplement (PROSource TF)  45 mL Per Tube TID   free water  200 mL Per Tube Q6H   insulin aspart  0-15 Units Subcutaneous Q4H   mouth rinse  15 mL Mouth Rinse 10 times per day   methadone  5 mg Per Tube Q8H   pantoprazole sodium  40 mg Per Tube Daily   polyethylene glycol  17 g Per Tube Daily   sodium chloride flush  10-40 mL Intracatheter Q12H   Continuous Infusions:  sodium chloride 10 mL/hr at 05/07/21 1100   sodium chloride     cisatracurium (NIMBEX) infusion 2 mg/mL Stopped (05/07/21 0809)   epoprostenol (VELETRI) for inhalation 1.60m/50mL (30,000 ng/mL) 50 ng/kg/min (05/07/21 1010)   feeding supplement (VITAL 1.5 CAL) 1,000 mL (05/07/21 0205)   HYDROmorphone 8 mg/hr (05/07/21 1100)   ketamine (KETALAR) Adult IV Infusion 2 mg/kg/hr (05/07/21 1100)   linezolid (ZYVOX) IV Stopped (05/07/21 1055)   meropenem (MERREM) IV Stopped (05/07/21 0610)   midazolam 20 mg/hr (05/07/21 1100)   propofol (DIPRIVAN) infusion 50 mcg/kg/min (05/07/21 1100)   PRN Meds:.Place/Maintain arterial line **AND** sodium chloride, acetaminophen **OR** acetaminophen (TYLENOL) oral liquid 160 mg/5 mL **OR** acetaminophen, cisatracurium, docusate, HYDROmorphone, midazolam, polyethylene glycol, sodium chloride flush No Known Allergies  OBJECTIVE: Blood pressure 108/62, pulse (!) 106, temperature 97.8 F (36.6 C), resp. rate (!) 31, height _0  (1.702 m), weight 80.5 kg, SpO2 93 %.  Physical Exam Constitutional:      General: He is not in acute distress.    Appearance: He is normal weight. He is not toxic-appearing.     Comments: Intubated and sedated  HENT:     Head: Normocephalic and atraumatic.     Right Ear: External ear normal.     Left Ear: External ear normal.     Nose: No congestion or rhinorrhea.     Mouth/Throat:     Mouth: Mucous membranes are moist.     Pharynx: Oropharynx is clear.  Eyes:     Extraocular Movements: Extraocular  movements intact.     Conjunctiva/sclera: Conjunctivae normal.     Pupils: Pupils are equal, round, and reactive to light.  Cardiovascular:     Rate and Rhythm: Normal rate and regular rhythm.     Heart sounds: No murmur heard.   No friction rub. No gallop.  Pulmonary:     Effort: Pulmonary effort is normal.     Breath sounds: Normal breath sounds.  Abdominal:     General: Abdomen is flat. Bowel sounds are normal.     Palpations: Abdomen is soft.  Musculoskeletal:        General: No swelling.     Cervical back: Normal range of motion and neck supple.  Skin:    General: Skin is warm.     Comments: Tattoos on left arm  Neurological:     General: No focal deficit present.     Mental Status: He is oriented to person, place, and time.  Psychiatric:        Mood and Affect: Mood normal.    Lab Results Lab Results  Component Value Date   WBC 35.8 (H) 05/07/2021   HGB 8.3 (L) 05/07/2021   HCT 26.5 (L) 05/07/2021   MCV 101.1 (H) 05/07/2021  PLT 300 05/07/2021    Lab Results  Component Value Date   CREATININE 0.57 (L) 05/07/2021   BUN 23 (H) 05/07/2021   NA 149 (H) 05/07/2021   K 3.6 05/07/2021   CL 106 05/07/2021   CO2 38 (H) 05/07/2021    Lab Results  Component Value Date   ALT 52 (H) 05/07/2021   AST 39 05/07/2021   ALKPHOS 138 (H) 05/07/2021   BILITOT 0.8 05/07/2021       Laurice Record, [place] 05/07/2021, 11:24 AM

## 2021-05-07 NOTE — Progress Notes (Addendum)
Pharmacy Antibiotic Note  Mathew Cooke is a 26 y.o. male admitted on 04/01/2021 with  Cavitary pneumonia . Patient has been on broad spectrum antibiotics for ~1 week. Per ID will now optimize to Vancomycin/Unasyn. No cultures, no causative organism identified. Pharmacy has been consulted for Vancomycin dosing. Patient is also receiving Unasyn.   Plan: Stopped Linezolid/Merrem/Eraxis Vancomycin 1750mg  IV x1, followed by Vancomycin 1500mg  IV q12h (Scr used 0.8, eAUC 462) Unasyn 3g IV q6h  Monitor renal function, clinical status F/u LOT and obtain vanc levels as appropriate   Height: 5\' 7"  (170.2 cm) Weight: 80.5 kg (177 lb 7.5 oz) IBW/kg (Calculated) : 66.1  Temp (24hrs), Avg:99.5 F (37.5 C), Min:97.8 F (36.6 C), Max:102.4 F (39.1 C)  Recent Labs  Lab 05/03/21 0324 05/04/21 0325 05/05/21 0356 05/06/21 0319 05/07/21 0435  WBC 43.3* 39.6* 41.9* 32.3* 35.8*  CREATININE 0.68 0.69 0.66 0.69 0.57*    Estimated Creatinine Clearance: 141.1 mL/min (A) (by C-G formula based on SCr of 0.57 mg/dL (L)).    No Known Allergies  Antimicrobials this admission:  Azithromycin 12/25 x1, 12/28 >> 12/29 Ceftriaxone 12/25 >> 12/28 Metronidazole 12/25 >> 12/28 Vancomycin 12/25 >> 12/28, 1/6 >>  Unasyn 12/28 >> 12/31, 1/6 >>   Linezolid 12/30 >> 1/6 Meropenem 12/31 >> 1/6 Anidulafungin 12/31 >> 1/3, 1/5 >> 1/6   Dose adjustments this admission:   Microbiology results: 12/25 Tracheal aspirate Cx: ng 12/25 BCx: ng 12/25 UCx: ng  12/25 MRSA PCR: not detected 12/29 Tracheal aspirate Cx: few normal flora  12/31 Tracheal aspirate Cx: Staph epidermidis (likely contaminant)   Thank you for allowing pharmacy to be a part of this patients care.  1/26, PharmD PGY1 Pharmacy Resident 05/07/2021 11:27 AM   Please check AMION for all [facility] Pharmacy phone numbers After 10:00 PM, call Main Pharmacy [phone]

## 2021-05-07 NOTE — Progress Notes (Addendum)
eLink Physician-Brief Progress Note Patient Name: Quadree Asberry DOB: 02/04/1996 MRN: [id number]   Date of Service  05/07/2021  HPI/Events of Note  Patient with desaturation and some ventilator dyssynchrony, looking at his waveforms, per bedside RN he has bilateral coarse breath sounds, fluid balance is positive but he received 60 mg of Lasix iv and has diuresed over 1 liter in response.  eICU Interventions  Stat portable CXR to r/o pneumothorax, Nimbex 8 mg iv x 1 for dyssynchrony, ABG, if the CXR is negative for pneumothorax will consider adjustments to ventilator settings, including raising the PEEP. ADDENDUM CXR shows worsening of patient's pneumothorax, PCCM Ground Crew asked to evaluate the patient.        Kerry Kass Malacai Grantz 05/07/2021, 9:40 PM

## 2021-05-07 NOTE — Plan of Care (Signed)

## 2021-05-08 ENCOUNTER — Inpatient Hospital Stay (HOSPITAL_COMMUNITY): Payer: Medicare Other

## 2021-05-08 DIAGNOSIS — J9601 Acute respiratory failure with hypoxia: Secondary | ICD-10-CM | POA: Diagnosis not present

## 2021-05-08 LAB — BASIC METABOLIC PANEL
Anion gap: 6 (ref 5–15)
BUN: 19 mg/dL (ref 6–20)
CO2: 44 mmol/L — ABNORMAL HIGH (ref 22–32)
Calcium: 8.6 mg/dL — ABNORMAL LOW (ref 8.9–10.3)
Chloride: 103 mmol/L (ref 98–111)
Creatinine, Ser: 0.55 mg/dL — ABNORMAL LOW (ref 0.61–1.24)
GFR, Estimated: >60 mL/min (ref >60)
Glucose, Bld: 124 mg/dL — ABNORMAL HIGH (ref 70–99)
Potassium: 3.6 mmol/L (ref 3.5–5.1)
Sodium: 153 mmol/L — ABNORMAL HIGH (ref 135–145)

## 2021-05-08 LAB — CBC
HCT: 29.5 % — ABNORMAL LOW (ref 39.0–52.0)
Hemoglobin: 8.6 g/dL — ABNORMAL LOW (ref 13.0–17.0)
MCH: 30.4 pg (ref 26.0–34.0)
MCHC: 29.2 g/dL — ABNORMAL LOW (ref 30.0–36.0)
MCV: 104.2 fL — ABNORMAL HIGH (ref 80.0–100.0)
Platelets: 252 10*3/uL (ref 150–400)
RBC: 2.83 MIL/uL — ABNORMAL LOW (ref 4.22–5.81)
RDW: 17 % — ABNORMAL HIGH (ref 11.5–15.5)
WBC: 38.3 10*3/uL — ABNORMAL HIGH (ref 4.0–10.5)
nRBC: 1 % — ABNORMAL HIGH (ref 0.0–0.2)

## 2021-05-08 LAB — HEPATITIS B SURFACE ANTIBODY, QUANTITATIVE: Hep B S AB Quant (Post): 390.3 m[IU]/mL (ref >9.9)

## 2021-05-08 LAB — POCT I-STAT 7, (LYTES, BLD GAS, ICA,H+H)
Acid-Base Excess: 20 mmol/L — ABNORMAL HIGH (ref 0.0–2.0)
Acid-Base Excess: 17 mmol/L — ABNORMAL HIGH (ref 0.0–2.0)
Bicarbonate: 47.9 mmol/L — ABNORMAL HIGH (ref 20.0–28.0)
Bicarbonate: 46.5 mmol/L — ABNORMAL HIGH (ref 20.0–28.0)
Calcium, Ion: 1.24 mmol/L (ref 1.15–1.40)
Calcium, Ion: 1.25 mmol/L (ref 1.15–1.40)
HCT: 25 % — ABNORMAL LOW (ref 39.0–52.0)
HCT: 29 % — ABNORMAL LOW (ref 39.0–52.0)
Hemoglobin: 8.5 g/dL — ABNORMAL LOW (ref 13.0–17.0)
Hemoglobin: 9.9 g/dL — ABNORMAL LOW (ref 13.0–17.0)
O2 Saturation: 97 %
O2 Saturation: 76 %
Patient temperature: 98.4
Patient temperature: 100.8
Potassium: 3.7 mmol/L (ref 3.5–5.1)
Potassium: 3.6 mmol/L (ref 3.5–5.1)
Sodium: 150 mmol/L — ABNORMAL HIGH (ref 135–145)
Sodium: 150 mmol/L — ABNORMAL HIGH (ref 135–145)
TCO2: >50 mmol/L — ABNORMAL HIGH (ref 22–32)
TCO2: 49 mmol/L — ABNORMAL HIGH (ref 22–32)
pCO2 arterial: 76.8 mmHg (ref 32.0–48.0)
pCO2 arterial: 99.6 mmHg (ref 32.0–48.0)
pH, Arterial: 7.402 (ref 7.350–7.450)
pH, Arterial: 7.283 — ABNORMAL LOW (ref 7.350–7.450)
pO2, Arterial: 92 mmHg (ref 83.0–108.0)
pO2, Arterial: 53 mmHg — ABNORMAL LOW (ref 83.0–108.0)

## 2021-05-08 LAB — GLUCOSE, CAPILLARY
Glucose-Capillary: 113 mg/dL — ABNORMAL HIGH (ref 70–99)
Glucose-Capillary: 113 mg/dL — ABNORMAL HIGH (ref 70–99)
Glucose-Capillary: 118 mg/dL — ABNORMAL HIGH (ref 70–99)
Glucose-Capillary: 120 mg/dL — ABNORMAL HIGH (ref 70–99)
Glucose-Capillary: 135 mg/dL — ABNORMAL HIGH (ref 70–99)
Glucose-Capillary: 153 mg/dL — ABNORMAL HIGH (ref 70–99)
Glucose-Capillary: 161 mg/dL — ABNORMAL HIGH (ref 70–99)

## 2021-05-08 LAB — PROTEIN, PLEURAL OR PERITONEAL FLUID: Total protein, fluid: 3.1 g/dL

## 2021-05-08 LAB — BODY FLUID CELL COUNT WITH DIFFERENTIAL
Lymphs, Fluid: 28 %
Neutrophil Count, Fluid: 72 % — ABNORMAL HIGH (ref 0–25)
Total Nucleated Cell Count, Fluid: 6804 cu mm — ABNORMAL HIGH (ref 0–1000)

## 2021-05-08 LAB — TRIGLYCERIDES: Triglycerides: 310 mg/dL — ABNORMAL HIGH (ref <150)

## 2021-05-08 LAB — MAGNESIUM: Magnesium: 2 mg/dL (ref 1.7–2.4)

## 2021-05-08 LAB — RPR: RPR Ser Ql: NONREACTIVE

## 2021-05-08 LAB — PHOSPHORUS: Phosphorus: 4.2 mg/dL (ref 2.5–4.6)

## 2021-05-08 MED ORDER — ROCURONIUM BROMIDE 10 MG/ML (PF) SYRINGE
PREFILLED_SYRINGE | INTRAVENOUS | Status: AC
  Administered 2021-05-08: 80 mg
  Filled 2021-05-08: qty 10

## 2021-05-08 MED ORDER — LIDOCAINE HCL (PF) 1 % IJ SOLN: 5.0000 mL | Freq: Once | INTRAMUSCULAR | Status: AC

## 2021-05-08 MED ORDER — CISATRACURIUM BOLUS VIA INFUSION
4.0000 mg | Freq: Once | INTRAVENOUS | Status: AC
  Administered 2021-05-08: 4 mg via INTRAVENOUS
  Filled 2021-05-08: qty 4

## 2021-05-08 MED ORDER — ROCURONIUM BROMIDE 10 MG/ML (PF) SYRINGE
PREFILLED_SYRINGE | INTRAVENOUS | Status: AC
  Administered 2021-05-08: 100 mg
  Filled 2021-05-08: qty 10

## 2021-05-08 MED ORDER — POTASSIUM CHLORIDE 20 MEQ PO PACK
40.0000 meq | PACK | Freq: Once | ORAL | Status: AC
  Administered 2021-05-08: 40 meq
  Filled 2021-05-08: qty 2

## 2021-05-08 MED ORDER — SODIUM CHLORIDE 0.9 % IV SOLN
2.0000 mg/h | INTRAVENOUS | Status: DC
  Administered 2021-05-08 – 2021-05-09 (×6): 37 mg/h via INTRAVENOUS
  Filled 2021-05-08 (×5): qty 50
  Filled 2021-05-08: qty 100
  Filled 2021-05-08 (×4): qty 50

## 2021-05-08 MED ORDER — CISATRACURIUM BESYLATE 20 MG/10ML IV SOLN
0.1000 mg/kg | INTRAVENOUS | Status: DC | PRN
  Filled 2021-05-08: qty 10

## 2021-05-08 MED ORDER — METHADONE HCL 10 MG/ML PO CONC
10.0000 mg | Freq: Three times a day (TID) | ORAL | Status: DC
  Administered 2021-05-08 – 2021-05-09 (×2): 10 mg
  Filled 2021-05-08 (×2): qty 5

## 2021-05-08 MED ORDER — ROCURONIUM BROMIDE 50 MG/5ML IV SOLN: 80.0000 mg | Freq: Once | INTRAVENOUS | Status: AC

## 2021-05-08 MED ORDER — LIDOCAINE HCL 1 % IJ SOLN
5.0000 mL | Freq: Once | INTRAMUSCULAR | Status: DC
  Filled 2021-05-08: qty 5

## 2021-05-08 MED ORDER — FREE WATER
200.0000 mL | Status: DC
  Administered 2021-05-08 – 2021-05-09 (×7): 200 mL

## 2021-05-08 MED ORDER — SODIUM CHLORIDE 0.9 % IV SOLN
0.0000 ug/kg/min | INTRAVENOUS | Status: DC
  Administered 2021-05-08: 3 ug/kg/min via INTRAVENOUS
  Administered 2021-05-09: 4 ug/kg/min via INTRAVENOUS
  Filled 2021-05-08 (×3): qty 20

## 2021-05-08 MED ORDER — METOLAZONE 5 MG PO TABS
5.0000 mg | ORAL_TABLET | Freq: Two times a day (BID) | ORAL | Status: AC
  Administered 2021-05-08 – 2021-05-09 (×2): 5 mg
  Filled 2021-05-08 (×2): qty 1

## 2021-05-08 MED ORDER — ROCURONIUM BROMIDE 10 MG/ML (PF) SYRINGE
100.0000 mg | PREFILLED_SYRINGE | Freq: Once | INTRAVENOUS | Status: AC
  Filled 2021-05-08: qty 10

## 2021-05-08 MED ORDER — MIDAZOLAM BOLUS VIA INFUSION
1.0000 mg | INTRAVENOUS | Status: DC | PRN
  Administered 2021-05-08: 2 mg via INTRAVENOUS
  Filled 2021-05-08: qty 2

## 2021-05-08 MED ORDER — LIDOCAINE HCL (PF) 1 % IJ SOLN
INTRAMUSCULAR | Status: AC
  Administered 2021-05-08: 5 mL
  Filled 2021-05-08: qty 5

## 2021-05-08 MED ORDER — SODIUM CHLORIDE 0.9% FLUSH
10.0000 mL | Freq: Three times a day (TID) | INTRAVENOUS | Status: DC
  Administered 2021-05-08 – 2021-05-09 (×3): 10 mL

## 2021-05-08 MED ORDER — ARTIFICIAL TEARS OPHTHALMIC OINT
1.0000 "application " | TOPICAL_OINTMENT | Freq: Three times a day (TID) | OPHTHALMIC | Status: DC
  Administered 2021-05-08 – 2021-05-09 (×3): 1 via OPHTHALMIC
  Filled 2021-05-08: qty 3.5

## 2021-05-08 NOTE — Progress Notes (Signed)
NAME:  Mathew Cooke, MRN:  [id number], DOB:  1996/01/17, LOS: 17 ADMISSION DATE:  04/24/2021, CONSULTATION DATE:  04/02/2021 REFERRING MD:  Norval Gable, CHIEF COMPLAINT:  dyspnea   History of Present Illness:  26 y o M with history of schizoaffective disorder, prior suicide attempt who was BIBEMS from home for dyspnea which had worsened over 2-3d PTA. Nausea, several episodes of emesis today. Left sided pleuritic CP. EMS gave him a liter of fluid.  In ED he was found to have necrotizing pneumonia and was given 1L LR, vanc, ceftriaxone and placed on nasal cannula which was escalated to BiPAP which he failed requiring endotracheal intubation.   Pertinent  Medical History  Schizoaffective disorder, prior suicide attempt  Significant Hospital Events: Including procedures, antibiotic start and stop dates in addition to other pertinent events   12/25 admitted, ETT placed. CT chest with extensive cavitary opacity c/f necrotizing PNA, adjacent air fluid levels in L pleural space. Started on veletri. Vec gtt. ECMO consult >not indicated at time of consult. NE, neo, vaso, solucortef  12/26 off vec gtt. Off neo  12/27 weaning veletri to off Dc solucortef> echo w/ septal motion inferior hypokinesis but no LV WMA. RV function mildly reduced. 12/28 weaning peep/fio2, changing RASS goal to -3. Off pressors. Abx narrowed. Vanc/ceftriaxone and flagyl stopped. Started on Unasyn and azith resumed until U legionella back.   12/29 Vecuronium infusion started, fever 101.8 12/31 Vent - 60%, PEEP 14, P/f 155, pPlat 28, DP 14. Decompensated later in the day, bronched. Proned, back on EPO. 1/1 bronch  1/2 did not tolerate proning  1/3 1 PRBC. Weaning epo. Air added to ETT cuff overnight multiple times  1/4 tube exchanged. Incr epo for hypoxia. Bronch, found bleeding from LLL. Night shift R tension ptx, got a surgical chest tube  1/5 1 PRBC for hgb 6.8. Wean PEEP to 14 for high peaks  1/6 DP 18 (plat 30, PEEP 12),  nimbex stopped, ID consult, abx changed to unasyn/ vanc   Interim History / Subjective:  - Worsening R ptx overnight- suspected CT sucking air, resolved with vaseline dressing - dyssynchronous overnight when versed weaned - did better overnight with left lung down   Objective   Blood pressure (!) 111/52, pulse (!) 110, temperature 98.5 F (36.9 C), temperature source Bladder, resp. rate (!) 30, height 5\' 7"  (1.702 m), weight 76.6 kg, SpO2 93 %.    Vent Mode: PRVC FiO2 (%):  [100 %] 100 % Set Rate:  [30 bmp] 30 bmp Vt Set:  [420 mL] 420 mL PEEP:  [12 cmH20] 12 cmH20 Plateau Pressure:  [27 cmH20-30 cmH20] 28 cmH20   Intake/Output Summary (Last 24 hours) at 05/08/2021 0748 Last data filed at 05/08/2021 0600 Gross per 24 hour  Intake 5461.3 ml  Output 8195 ml  Net -2733.7 ml   Filed Weights   05/06/21 0323 05/07/21 0435 05/08/21 0357  Weight: 75.1 kg 80.5 kg 76.6 kg    Examination: General:  critically ill young adult male sedated on MV, NAD HEENT: MM pink/moist, ETT 8, 26 at lip, R cortrak, pupils 3/minimally reactive Neuro: sedated RASS -5 CV: ST, no murmur PULM:  diffuse rhonchi, right CT to 40 cm sx, no airleak, scant bloodly tracheal secretions- PIP 33, plat 30, dp 18, PF ratio 92 GI: soft, bs+, foley- cyu, flexiseal  Extremities: warm/dry, generalized +2-3 edema    UOP 7.1 L / 24 hours  Remains net +8.6L  Stool output 960ml/ 24hrs   Remains on dilaudid  16 mg/hr, ketamine 3, versed 37mg  /hr, propofol 50, and veletri at [facility] Problem list   Septic shock resolved as of 12/28 Shock liver resolved as of 12/27  Lactic Acidosis  AKI Hyperphosphatemia   Assessment & Plan:    Acute hypoxic respiratory failure with ARDS due to LLL necrotizing PNA- culture neg; LE duplex neg, ongoing and unresponsive to abx Likely aspiration PNA L pleural effusion LLL bleeding from necrosis R tension PTX s/p chest tube- near resolved on imaging Need for sedation for  mechanical ventilation due to ARDS  Severe sepsis due to necrotizing PNA with multisystem organ dysfunction Septic cardiomyopathy ABLA presumed from LLL Hyperglycemia  Hx Schizoaffective  Tobacco abuse THC abuse Vaping - continue ARDS protocol, PRVC currently ~6.5 cc/kg, PEEP 12, FiO2 1.0 - goal to reduce PEEP to reduce risk of BPF on R - pending am CXR and ABG - continue veletri, wean as able  - continue CT to suction - seen by ID 1/6, appreciate recs.  Abx changed to unasyn/ vanc, TEE ordered, ideally if we could get chest CT to further evaluate empyema further, but can not travel on veletri- will assess on bedside US for now - request bacteral/ fungal/ afb cx if bronch again - sending Glucomannan and fungitell serum, Urine histo Ag, Urine blast Ag, Coccidioides Ag and Cryptococcal Ag serum, IGRA, hep panel, and PRP given history - No further proning (no benefit, risk of worsening pulmonary hemorrhage and pneumothorax).  Likes left lung down  - Maintain sedation for vent synchrony, prn nimbex, (gtt stopped 1/6) - continue diuresis with lasix and diamox, s/p metolazone 1/6 with good diuresis and renal function remains stable - increase FWF - grim prognosis    Best Practice (right click and "Reselect all SmartList Selections" daily)  Diet/type: tubefeeds DVT prophylaxis: LMWH GI prophylaxis: Protonix Lines: Yes needed Foley: Yes needed Code Status:  full code Last date of multidisciplinary goals of care discussion: 1/4   Pending family update 1/7  CCT 40 mins    Kennieth Rad, ACNP [facility] 05/08/2021, 7:48 AM  See Amion for pager If no response to pager, please call PCCM consult pager After 7:00 pm call Elink

## 2021-05-08 NOTE — Progress Notes (Signed)
[facility] ADULT ICU REPLACEMENT PROTOCOL   The patient does apply for the [facility] Adult ICU Electrolyte Replacment Protocol based on the criteria listed below:   1.Exclusion criteria: TCTS patients, ECMO patients, and Dialysis patients 2. Is GFR >/= 30 ml/min? Yes.    Patient's GFR today is >60 3. Is SCr </= 2? Yes.   Patient's SCr is 0.55 mg/dL 4. Did SCr increase >/= 0.5 in 24 hours? No. 5.Pt's weight >40kg  Yes.   6. Abnormal electrolyte(s): K+ 3.6  7. Electrolytes replaced per protocol 8.  Call MD STAT for K+ </= 2.5, Phos </= 1, or Mag </= 1 Physician:  n/a  Melvern Banker 05/08/2021 6:19 AM

## 2021-05-08 NOTE — Consult Note (Signed)
°  Palliative Medicine Inpatient Consult Note  Reason for consult: High vent settlings, unable to wean. Chart reviewed, patient seen in ICU, unable to reach family.  Family arrived at end of day when I was at [facility]. nursing staff helped with scheduling meeting with family for 2 pm Sunday 1/8.  No charge.   Jerrye Bushy, NP [facility] Palliative Medicine Team Team Cell Phone: [phone] Please utilize secure chat with additional questions, if there is no response within 30 minutes please call the above phone number  Palliative Medicine Team providers are available by phone from 7am to 7pm daily and can be reached through the team cell phone.  Should this patient require assistance outside of these hours, please call the patient's attending physician.

## 2021-05-08 NOTE — Plan of Care (Signed)
?  Problem: Education: ?Goal: Knowledge of General Education information will improve ?Description: Including pain rating scale, medication(s)/side effects and non-pharmacologic comfort measures ?Outcome: Progressing ?  ?Problem: Health Behavior/Discharge Planning: ?Goal: Ability to manage health-related needs will improve ?Outcome: Progressing ?  ?Problem: Clinical Measurements: ?Goal: Ability to maintain clinical measurements within normal limits will improve ?Outcome: Progressing ?Goal: Will remain free from infection ?Outcome: Progressing ?Goal: Diagnostic test results will improve ?Outcome: Progressing ?Goal: Cardiovascular complication will be avoided ?Outcome: Progressing ?  ?Problem: Activity: ?Goal: Risk for activity intolerance will decrease ?Outcome: Progressing ?  ?Problem: Nutrition: ?Goal: Adequate nutrition will be maintained ?Outcome: Progressing ?  ?Problem: Coping: ?Goal: Level of anxiety will decrease ?Outcome: Progressing ?  ?Problem: Elimination: ?Goal: Will not experience complications related to bowel motility ?Outcome: Progressing ?Goal: Will not experience complications related to urinary retention ?Outcome: Progressing ?  ?Problem: Pain Managment: ?Goal: General experience of comfort will improve ?Outcome: Progressing ?  ?Problem: Safety: ?Goal: Ability to remain free from injury will improve ?Outcome: Progressing ?  ?Problem: Skin Integrity: ?Goal: Risk for impaired skin integrity will decrease ?Outcome: Progressing ?  ?Problem: Clinical Measurements: ?Goal: Respiratory complications will improve ?Outcome: Not Progressing ?  ?

## 2021-05-08 NOTE — Procedures (Signed)
Insertion of Chest Tube Procedure Note  Mathew Cooke  [id number]  1995-12-06  Date:05/08/21  Time:2:23 PM    Provider Performing: Lorin Glass   Procedure: Pleural Catheter Insertion w/ Imaging Guidance (60737)  Indication(s) Hemothorax  Consent Risks of the procedure as well as the alternatives and risks of each were explained to the patient and/or caregiver.  Consent for the procedure was obtained and is signed in the bedside chart  Anesthesia Topical only with 1% lidocaine    Time Out Verified patient identification, verified procedure, site/side was marked, verified correct patient position, special equipment/implants available, medications/allergies/relevant history reviewed, required imaging and test results available.   Sterile Technique Maximal sterile technique including full sterile barrier drape, hand hygiene, sterile gown, sterile gloves, mask, hair covering, sterile ultrasound probe cover (if used).   Procedure Description Ultrasound used to identify appropriate pleural anatomy for placement and overlying skin marked. Area of placement cleaned and draped in sterile fashion.  A 14 French pigtail pleural catheter was placed into the left pleural space using Seldinger technique. Appropriate return of fluid was obtained.  The tube was connected to atrium and placed on -20 cm H2O wall suction.   Complications/Tolerance None; patient tolerated the procedure well. Chest X-ray is ordered to verify placement.   EBL Minimal  Specimen(s) fluid

## 2021-05-08 NOTE — Progress Notes (Signed)
eLink Physician-Brief Progress Note Patient Name: Mathew Cooke DOB: December 18, 1995 MRN: [id number]   Date of Service  05/08/2021  HPI/Events of Note  ARDS.  Bilat chest tubes in place.  PTX on right, pleural effusion on the left.  On 100% FIO2 and peep 12.  O2 sat 72%.  eICU Interventions  Stat cxr and abg Peep increased to 14 Slow increase in O2 sat     Intervention Category Major Interventions: Hypoxemia - evaluation and management  Henry Russel, P 05/08/2021, 8:30 PM

## 2021-05-08 NOTE — Progress Notes (Addendum)
Pharmacy Note  Per discussion with Dr. Tamala Julian, methadone will be increased to 10mg  PT TID. QTc resulted 440 today. Will continue to monitor.   Arturo Morton, PharmD, BCPS Please check AMION for all [facility] contact numbers Clinical Pharmacist 05/08/2021 3:17 PM

## 2021-05-08 NOTE — Procedures (Signed)
Bronchoscopy Procedure Note  Mathew Cooke  [id number]  1995/07/09  Date:05/08/21  Time:6:12 PM   Provider Performing:Seraphine Gudiel C Tamala Julian   Procedure(s):  Flexible bronchoscopy with bronchial alveolar lavage [phone]) and Subsequent Therapeutic Aspiration of Tracheobronchial Tree (39767)  Indication(s) Mucus plugging, desaturations  Consent Unable to obtain consent due to emergent nature of procedure.  Anesthesia In place for mechanical ventilation   Time Out Verified patient identification, verified procedure, site/side was marked, verified correct patient position, special equipment/implants available, medications/allergies/relevant history reviewed, required imaging and test results available.   Sterile Technique Usual hand hygiene, masks, gowns, and gloves were used   Procedure Description Bronchoscope advanced through endotracheal tube and into airway.  Airways were examined down to subsegmental level with findings noted below.   Following diagnostic evaluation, BAL(s) performed in LLL with normal saline and return of mucopurulent fluid and Therapeutic aspiration performed in LLL  Findings:  - Old blood clot occluding LLL and spilling into right mainstem likely accounting for desats - Behind this inspissated clot was mucopurulent secretions sent for culture and all studies outlined by ID   Complications/Tolerance None; patient tolerated the procedure well. Chest X-ray is not needed post procedure.   EBL Minimal   Specimen(s) LLL BAL

## 2021-05-09 ENCOUNTER — Other Ambulatory Visit (HOSPITAL_COMMUNITY): Payer: Medicare Other

## 2021-05-09 ENCOUNTER — Inpatient Hospital Stay (HOSPITAL_COMMUNITY): Payer: Medicare Other

## 2021-05-09 DIAGNOSIS — Z515 Encounter for palliative care: Secondary | ICD-10-CM

## 2021-05-09 DIAGNOSIS — Z978 Presence of other specified devices: Secondary | ICD-10-CM

## 2021-05-09 DIAGNOSIS — J9601 Acute respiratory failure with hypoxia: Secondary | ICD-10-CM | POA: Diagnosis not present

## 2021-05-09 DIAGNOSIS — Z7189 Other specified counseling: Secondary | ICD-10-CM

## 2021-05-09 LAB — TRIGLYCERIDES: Triglycerides: 314 mg/dL — ABNORMAL HIGH (ref <150)

## 2021-05-09 LAB — BODY FLUID CELL COUNT WITH DIFFERENTIAL
Lymphs, Fluid: 2 %
Monocyte-Macrophage-Serous Fluid: 6 % — ABNORMAL LOW (ref 50–90)
Neutrophil Count, Fluid: 92 % — ABNORMAL HIGH (ref 0–25)
Total Nucleated Cell Count, Fluid: 2010 cu mm — ABNORMAL HIGH (ref 0–1000)

## 2021-05-09 LAB — GLUCOSE, CAPILLARY
Glucose-Capillary: 142 mg/dL — ABNORMAL HIGH (ref 70–99)
Glucose-Capillary: 127 mg/dL — ABNORMAL HIGH (ref 70–99)
Glucose-Capillary: 120 mg/dL — ABNORMAL HIGH (ref 70–99)

## 2021-05-09 LAB — POCT I-STAT 7, (LYTES, BLD GAS, ICA,H+H)
Acid-Base Excess: 22 mmol/L — ABNORMAL HIGH (ref 0.0–2.0)
Bicarbonate: 48.9 mmol/L — ABNORMAL HIGH (ref 20.0–28.0)
Calcium, Ion: 1.17 mmol/L (ref 1.15–1.40)
HCT: 23 % — ABNORMAL LOW (ref 39.0–52.0)
Hemoglobin: 7.8 g/dL — ABNORMAL LOW (ref 13.0–17.0)
O2 Saturation: 96 %
Patient temperature: 98.2
Potassium: 3.3 mmol/L — ABNORMAL LOW (ref 3.5–5.1)
Sodium: 149 mmol/L — ABNORMAL HIGH (ref 135–145)
TCO2: >50 mmol/L — ABNORMAL HIGH (ref 22–32)
pCO2 arterial: 75.5 mmHg (ref 32.0–48.0)
pH, Arterial: 7.419 (ref 7.350–7.450)
pO2, Arterial: 85 mmHg (ref 83.0–108.0)

## 2021-05-09 LAB — CBC
HCT: 27.8 % — ABNORMAL LOW (ref 39.0–52.0)
Hemoglobin: 8.2 g/dL — ABNORMAL LOW (ref 13.0–17.0)
MCH: 30.7 pg (ref 26.0–34.0)
MCHC: 29.5 g/dL — ABNORMAL LOW (ref 30.0–36.0)
MCV: 104.1 fL — ABNORMAL HIGH (ref 80.0–100.0)
Platelets: 235 10*3/uL (ref 150–400)
RBC: 2.67 MIL/uL — ABNORMAL LOW (ref 4.22–5.81)
RDW: 17 % — ABNORMAL HIGH (ref 11.5–15.5)
WBC: 28.4 10*3/uL — ABNORMAL HIGH (ref 4.0–10.5)
nRBC: 0.9 % — ABNORMAL HIGH (ref 0.0–0.2)

## 2021-05-09 LAB — BASIC METABOLIC PANEL
BUN: 21 mg/dL — ABNORMAL HIGH (ref 6–20)
CO2: >45 mmol/L — ABNORMAL HIGH (ref 22–32)
Calcium: 8.5 mg/dL — ABNORMAL LOW (ref 8.9–10.3)
Chloride: 97 mmol/L — ABNORMAL LOW (ref 98–111)
Creatinine, Ser: 0.47 mg/dL — ABNORMAL LOW (ref 0.61–1.24)
GFR, Estimated: >60 mL/min (ref >60)
Glucose, Bld: 135 mg/dL — ABNORMAL HIGH (ref 70–99)
Potassium: 3.2 mmol/L — ABNORMAL LOW (ref 3.5–5.1)
Sodium: 153 mmol/L — ABNORMAL HIGH (ref 135–145)

## 2021-05-09 LAB — MAGNESIUM: Magnesium: 1.9 mg/dL (ref 1.7–2.4)

## 2021-05-09 LAB — PHOSPHORUS: Phosphorus: 4.4 mg/dL (ref 2.5–4.6)

## 2021-05-09 MED ORDER — POTASSIUM CHLORIDE 20 MEQ PO PACK
20.0000 meq | PACK | ORAL | Status: AC
  Administered 2021-05-09 (×2): 20 meq
  Filled 2021-05-09 (×2): qty 1

## 2021-05-09 MED ORDER — MAGNESIUM SULFATE 2 GM/50ML IV SOLN
2.0000 g | Freq: Once | INTRAVENOUS | Status: AC
  Administered 2021-05-09: 2 g via INTRAVENOUS
  Filled 2021-05-09: qty 50

## 2021-05-09 MED ORDER — POTASSIUM CHLORIDE 10 MEQ/50ML IV SOLN
10.0000 meq | INTRAVENOUS | Status: AC
  Administered 2021-05-09 (×4): 10 meq via INTRAVENOUS
  Filled 2021-05-09 (×4): qty 50

## 2021-05-09 MED ORDER — GLYCOPYRROLATE 1 MG PO TABS: 1.0000 mg | ORAL_TABLET | ORAL | Status: DC | PRN

## 2021-05-09 MED ORDER — GLYCOPYRROLATE 0.2 MG/ML IJ SOLN: 0.2000 mg | INTRAMUSCULAR | Status: DC | PRN

## 2021-05-11 LAB — ACID FAST SMEAR (AFB, MYCOBACTERIA): Acid Fast Smear: NEGATIVE

## 2021-05-11 LAB — QUANTIFERON-TB GOLD PLUS: QuantiFERON-TB Gold Plus: UNDETERMINED — AB

## 2021-05-11 LAB — MISC LABCORP TEST (SEND OUT): Labcorp test code: 9985

## 2021-05-11 LAB — CULTURE, RESPIRATORY W GRAM STAIN: Culture: NORMAL

## 2021-05-11 LAB — QUANTIFERON-TB GOLD PLUS (RQFGPL)
QuantiFERON Mitogen Value: 0.09 IU/mL
QuantiFERON Nil Value: 0 IU/mL
QuantiFERON TB1 Ag Value: 0 IU/mL
QuantiFERON TB2 Ag Value: 0 IU/mL

## 2021-05-11 LAB — PATHOLOGIST SMEAR REVIEW

## 2021-05-12 LAB — BODY FLUID CULTURE W GRAM STAIN: Culture: NO GROWTH

## 2021-05-12 LAB — FUNGITELL, SERUM: Fungitell Result: <31 pg/mL (ref <80)

## 2021-05-12 LAB — BLASTOMYCES ANTIGEN: Blastomyces Antigen: NOT DETECTED ng/mL

## 2021-05-13 LAB — PNEUMOCYSTIS JIROVECI SMEAR BY DFA

## 2021-05-14 MED FILL — Medication: Qty: 1 | Status: AC

## 2021-05-15 LAB — ASPERGILLUS ANTIGEN, BAL/SERUM: Aspergillus Ag, BAL/Serum: 0.07 Index (ref 0.00–0.49)

## 2021-06-02 NOTE — Progress Notes (Signed)
Rt called to pt room for low spo2 peak press alarms at 60. Unable to pass sx cath and unable to bag pt. Removed ET tube. Bag mask ineffective and no improvement. RT intubated pt ET tube taped in place. BS bilateral ETCO2 color change. Dr Katrinka Blazing aware

## 2021-06-02 NOTE — Progress Notes (Signed)
[facility] ADULT ICU REPLACEMENT PROTOCOL   The patient does apply for the [facility] Adult ICU Electrolyte Replacment Protocol based on the criteria listed below:   1.Exclusion criteria: TCTS patients, ECMO patients, and Dialysis patients 2. Is GFR >/= 30 ml/min? Yes.    Patient's GFR today is >60 3. Is SCr </= 2? Yes.   Patient's SCr is 0.47 mg/dL 4. Did SCr increase >/= 0.5 in 24 hours? No. 5.Pt's weight >40kg  Yes 6. Abnormal electrolyte(s): K+ 3.2, Mag 1.9  7. Electrolytes replaced per protocol 8.  Call MD STAT for K+ </= 2.5, Phos </= 1, or Mag </= 1 Physician:  Jannette Spanner May 15, 2021 5:40 AM

## 2021-06-02 NOTE — Death Summary Note (Addendum)
°  DEATH SUMMARY   Patient Details  Name: Mathew Cooke MRN: [id number] DOB: 30-Sep-1995  Admission/Discharge Information   Admit Date:  10-May-2021  Date of Death: Date of Death: 05/24/21  Time of Death: Time of Death: Aug 31, 1457  Length of Stay: 08-29-22  Referring Physician: Patient, No Pcp Per (Inactive)   Reason(s) for Hospitalization  Acute respiratory failure  Diagnoses  Acute hypoxic respiratory failure with ARDS due to LLL necrotizing PNA Aspiration PNA L pleural effusion LLL bleeding from necrosis R tension PTX s/p chest tube Need for sedation for mechanical ventilation due to ARDS  Severe sepsis due to necrotizing PNA with multisystem organ dysfunction Septic cardiomyopathy ABLA presumed from LLL Hyperglycemia  Hx Schizoaffective  Tobacco abuse THC abuse Vaping Comfort care. DNR  Preliminary cause of death:  Secondary Diagnoses (including complications and co-morbidities):  Principal Problem:   Acute hypoxemic respiratory failure (HCC) Active Problems:   ARDS (adult respiratory distress syndrome) (HCC)   Sepsis [facility])   Brief Hospital Course (including significant findings, care, treatment, and services provided and events leading to death)  [name] is a 26 y.o. year old male with history of schizoaffective disorder, prior suicide attempt who was BIBEMS from home for dyspnea which had worsened over 2-3d PTA.    In ED he was found to have necrotizing pneumonia and was given 1L LR, vanc, ceftriaxone and placed on nasal cannula which was escalated to BiPAP which he failed requiring endotracheal intubation. CT chest with extensive cavitary opacity c/f necrotizing PNA, adjacent air fluid levels in L pleural space. Started on veletri. Vec gtt. ECMO consult >not indicated at time of consult.  He had a prolonged stay with severe ARDS requiring proning, paralytics, inhaled epoprostenol.  Developed pneumothorax requiring chest tube placement. bronched multiple times with  findings of bleeding in the left lower lobe with purulent secretions.  He continues to deteriorate with difficulty ventilating and desaturations on 2022-05-24.  After family discussions the decision was made to transition to comfort measures with DNR but before he could be terminally extubated.  He went into cardiac arrest and passed away.  Signature:   Chilton Greathouse MD [facility] 2021/05/24, 6:25 PM

## 2021-06-02 NOTE — Progress Notes (Signed)
Wasted of Versed and of Dilaudid  in stericycle bin in medication room with Kellie Shropshire RN.   Arlyce Dice

## 2021-06-02 NOTE — Procedures (Signed)
Bronchoscopy Procedure Note  Mathew Cooke  [id number]  1996/03/05  Date:May 17, 2021  Time:1:40 PM   Provider Performing:Moniqua Engebretsen   Procedure(s):  Flexible Bronchoscopy (09643)  Indication(s) Acute resp failure  Consent Unable to obtain consent due to emergent nature of procedure.   Time Out Verified patient identification, verified procedure, site/side was marked, verified correct patient position, special equipment/implants available, medications/allergies/relevant history reviewed, required imaging and test results available.   Sterile Technique Usual hand hygiene, masks, gowns, and gloves were used   Procedure Description Bronchoscope advanced through endotracheal tube and into airway.  Airways were examined down to subsegmental level with findings noted below.   Following diagnostic evaluation, Therapeutic aspiration performed in LLL  Findings:  Similar findings to yesterday of blood clot in the left lower lobe with some extending to the right side.  This was cleared out with saline lavages and suctioning.  Also noted mucopurulent secretions from the lower lobe the left   Complications/Tolerance None; patient tolerated the procedure well. Chest X-ray is not needed post procedure.   EBL Minimal  Mathew Garfinkel MD [facility] See Amion for pager  If no response to pager , please call [phone] until 7pm After 7:00 pm call Elink  [phone] 2021/05/17, 1:42 PM

## 2021-06-02 NOTE — Progress Notes (Signed)
NAME:  Mathew Cooke, MRN:  [id number], DOB:  [birth year], LOS: 41 ADMISSION DATE:  04/28/2021, CONSULTATION DATE:  04/16/2021 REFERRING MD:  Norval Gable, CHIEF COMPLAINT:  dyspnea   History of Present Illness:  26 y o M with history of schizoaffective disorder, prior suicide attempt who was BIBEMS from home for dyspnea which had worsened over 2-3d PTA. Nausea, several episodes of emesis today. Left sided pleuritic CP. EMS gave him a liter of fluid.  In ED he was found to have necrotizing pneumonia and was given 1L LR, vanc, ceftriaxone and placed on nasal cannula which was escalated to BiPAP which he failed requiring endotracheal intubation.   Pertinent  Medical History  Schizoaffective disorder, prior suicide attempt  Significant Hospital Events: Including procedures, antibiotic start and stop dates in addition to other pertinent events   12/25 admitted, ETT placed. CT chest with extensive cavitary opacity c/f necrotizing PNA, adjacent air fluid levels in L pleural space. Started on veletri. Vec gtt. ECMO consult >not indicated at time of consult. NE, neo, vaso, solucortef  12/26 off vec gtt. Off neo  12/27 weaning veletri to off Dc solucortef> echo w/ septal motion inferior hypokinesis but no LV WMA. RV function mildly reduced. 12/28 weaning peep/fio2, changing RASS goal to -3. Off pressors. Abx narrowed. Vanc/ceftriaxone and flagyl stopped. Started on Unasyn and azith resumed until U legionella back.   12/29 Vecuronium infusion started, fever 101.8 12/31 Vent - 60%, PEEP 14, P/f 155, pPlat 28, DP 14. Decompensated later in the day, bronched. Proned, back on EPO. 1/1 bronch  1/2 did not tolerate proning  1/3 1 PRBC. Weaning epo. Air added to ETT cuff overnight multiple times  1/4 tube exchanged. Incr epo for hypoxia. Bronch, found bleeding from LLL. Night shift R tension ptx, got a surgical chest tube  1/5 1 PRBC for hgb 6.8. Wean PEEP to 14 for high peaks  1/6 DP 18 (plat 30, PEEP 12),  nimbex stopped, ID consult, abx changed to unasyn/ vanc  1/7 back on nimbex for vent dyssynchrony/ destat, BAL, ketamine stopped, worsening R ptx overnight, diuresing, methadone increased   Interim History / Subjective:  Back on nimbex and s/p bronch with BAL 1/7 No events overnight  Objective   Blood pressure (!) 112/58, pulse (!) 116, temperature 97.9 F (36.6 C), temperature source Bladder, resp. rate (!) 30, height _0  (1.702 m), weight 76.6 kg, SpO2 93 %.    Vent Mode: PRVC FiO2 (%):  [100 %] 100 % Set Rate:  [30 bmp] 30 bmp Vt Set:  [420 mL] 420 mL PEEP:  [12 cmH20-14 cmH20] 14 cmH20 Plateau Pressure:  [28 cmH20-33 cmH20] 31 cmH20   Intake/Output Summary (Last 24 hours) at 26-May-2021 1034 Last data filed at 2021/05/26 0800 Gross per 24 hour  Intake 7676.73 ml  Output 6552 ml  Net 1124.73 ml   Filed Weights   05/06/21 0323 05/07/21 0435 05/08/21 0357  Weight: 75.1 kg 80.5 kg 76.6 kg    Examination: General:  critically ill young adult male sedated/ paralyzed on MV HEENT: MM pink/moist, ETT, R cortrak, pupils 3/sluggish, left scleral edema Neuro: sedated paralyzed CV: ST PULM:  MV supported breaths, coarse throughout, R CT to 40sxn, no airleak, L pigtail to 20 sxn, no airleak- minimal output, plat 31, dp 17 GI: soft, bs +, foley, flexiseal Extremities: warm/dry, improving edema, still 2+ edema   UOP 6.9L / 24 hours  Remains net +9.1L  Stool output 95m/ 24hrs > 400 ml  Remains on  dilaudid 8 mg/hr, versed 24m /hr, propofol 50, and veletri at 50, nimbex   WBC 38.3 > 28.4, stable Hgb, stable sCr   Resolved Hospital Problem list   Septic shock resolved as of 12/28 Shock liver resolved as of 12/27  Lactic Acidosis  AKI Hyperphosphatemia   Assessment & Plan:    Acute hypoxic respiratory failure with ARDS due to LLL necrotizing PNA- culture neg; LE duplex neg, ongoing and unresponsive to abx Likely aspiration PNA L pleural effusion LLL bleeding from  necrosis R tension PTX s/p chest tube- near resolved on imaging Need for sedation for mechanical ventilation due to ARDS  Severe sepsis due to necrotizing PNA with multisystem organ dysfunction Septic cardiomyopathy ABLA presumed from LLL Hyperglycemia  Hx Schizoaffective  Tobacco abuse THC abuse Vaping - continue ARDS protocol, PRVC currently ~6.5 cc/kg - wean PEEP/ FiO2 per ARDS protocol  - minimize PEEP as able to reduce risk of BPF  - serial ABG/ CXR - continue iEPO, has not tolerated prior weans (last attempted 1/5) - continue bilateral CT to suction.  Follow pleural studies/ culture sent 1/7 - s/p BAL 1/7 - follow cultures/ fungal/ afb - check bubble study today to r/o PFO and look at RV - consider bedside TEE and chest CT when/ if he is more stable.  Usually do not transport while on iEPO - ID following appreciate recs, continue unasyn/ vanc per ID recs - Glucomannan and fungitell serum, Urine histo Ag, Urine blast Ag , Coccidioides Ag, IGRA pending - Cryptococcal Ag serum, hepatitis panel, RPR neg - No further proning (no benefit, risk of worsening pulmonary hemorrhage and pneumothorax).  Likes left lung down  - continue dilaudid, versed, propofol (on since 12/29- monitor for propofol infusion syndrome), and nimbex for vent synchrony  - monitor Qtc while on methadone, increased 1/7 - will discuss further diuresis with attending, bicarb > 45 - continue FWF - grim prognosis, appreciate PMT assistance   Best Practice (right click and "Reselect all SmartList Selections" daily)  Diet/type: tubefeeds DVT prophylaxis: LMWH GI prophylaxis: Protonix Lines: Yes needed Foley: Yes needed Code Status:  full code Last date of multidisciplinary goals of care discussion: 1/4   Mother/ father updated 1/7  CCT 40 mins    BKennieth Rad ACNP [facility] 104-Feb-2023 10:34 AM  See Amion for pager If no response to pager, please call PCCM consult pager After  7:00 pm call Elink

## 2021-06-02 NOTE — Progress Notes (Signed)
Pt no longer breathing or has a pulse. Ventilator turned off at this time. ETT remains in place. RT will continue to be available as needed.

## 2021-06-02 NOTE — Progress Notes (Signed)
PCCM Interval Note   Called to bedside emergently.    Patient's ETT had become obstructed and began to desaturate.  ETT removed, poor BVM, and he was reintubated by RT emergently.  Patient then began to have copious emesis/ aspiration.    Blood reported in posterior pharynx on intubation.  OGT was placed, with some BRB suctioned followed by return to gastric contents.   Sats have not improved beyond 58% despite recruitments maneuvers.  Currently undergoing emergent FOB by Dr. Isaiah Serge.    Mother is on her way.  Will update on arrival.      Posey Boyer, ACNP [facility] 05/29/2021, 1:32 PM  See Amion for pager If no response to pager, please call PCCM consult pager After 7:00 pm call Elink

## 2021-06-02 NOTE — Consult Note (Signed)
Palliative Medicine Inpatient Consult Note  Reason for consult:  Initial consult was due to high vent settings and inability to wean to discuss GOC  HPI:  Per intake H&P12/25 by Leslye Peer --> "25yM with history of schizoaffective disorder, prior suicide attempt who was BIBEMS from home for dyspnea which had worsened over 2-3d PTA. History obtained thru chart review has he is in extremis from respiratory standpoint at time of my evaluation. Nausea, several episodes of emesis today. Left sided pleuritic CP. EMS gave him a liter of fluid."   In ED he was found to have necrotizing pneumonia and was given 1L LR, vanc, ceftriaxone and placed on nasal cannula which was escalated to BiPAP.   He was subsequently intubated, ventilated and left Chest tube placed per critical care progress notes. He has severe necrotizing pneumonia with profound hypoxemic respiratory failure. He has been difficult to ventilator in the ICU, proning was attempting but he did not tolerate it. He was bronched on 1/4 with bleed from LLL and a chest tube was placed for right tension pneumothorax. He has been on off and back on Nimbex infusion for vent dyssynchrony/desaturation.  This afternoon he had low Spo2 and could not be ventilated due to ETT obstruction. ETT was removed and he was emergently reintubated. His oxygen saturations did not improve with aggressive measures and he continued to decline.  I was called by Jennelle Human, NP on M3 as they had not been able to reach the family. I shared I had a meeting at 2 pm scheduled with the Mother. I arrived on the unit soon after the mother arrived. Nevin Bloodgood had already briefed her on Cliffard's condition. I met with mother and then Jennelle Human, NP and I had a goals of care meeting with Father via phone. Please see Nevin Bloodgood Simpson's Interdisciplinary Goals of Care Family Meeting notes for more details.    Clinical Assessment/Goals of Care: I have reviewed medical records including  EPIC notes, labs and imaging, received report from bedside RNs, assessed the patient.    I met with Mother Meredith Mody to further discuss diagnosis prognosis, GOC, EOL wishes, disposition and options.   I introduced Palliative Medicine as specialized medical care for people living with serious illness. It focuses on providing relief from the symptoms and stress of a serious illness. The goal is to improve quality of life for both the patient and the family.  We discussed Gleason's hospitalization, disease progression, current state and the inability to oxygenate and ventilate him. His oxygen saturations were continuing to drop along with his blood pressure.  She was agreeable to keeping him comfortable and to allow a natural death. Jennelle Human NP and I then had a phone conversation with his Father Trilby Drummer who lives in [place].  See goals of care note for details. We offered a virtual visit but Trilby Drummer declined. [place] and Trilby Drummer then spoke together by phone and agreed to no chest compressions or defibrillation or additional respiratory interventions, to provide comfort measurers and to allow a natural death. Nimbex was stopped. Chaplain, Tye [place] arrived to help provide family support. Mother was able to talk to her son and hold his hand. She was present in the room when he died.  The Chaplain and I provided emotional support and answered questions. The Mother's cousin was called to come to the hospital.  The mother was appropriately grieving.   Decision Maker:Patient's mother Meredith Mody and Father Garland [place] together.   SUMMARY OF RECOMMENDATIONS    Code  Status/Advance Care Planning:  DNAR/DNI  Psycho-social/Spiritual:  Desire for further Chaplaincy support: Yes, consulted for continued family support     This conversation/these recommendations were discussed with patient primary care team, Jennelle Human, NP.  Thank you for the opportunity to participate in the care of this patient  and family.   Time: 80 minutes Greater than 50%  of this time was spent counseling and coordinating care related to the above assessment and plan.  Lindell Spar, ACNP [facility] Team Team Cell Phone: [phone] Please utilize secure chat with additional questions, if there is no response within 30 minutes please call the above phone number  Palliative Medicine Team providers are available by phone from 7am to 7pm daily and can be reached through the team cell phone.  Should this patient require assistance outside of these hours, please call the patient's attending physician.

## 2021-06-02 NOTE — Procedures (Signed)
Intubation Procedure Note  Mathew Cooke  [id number]  Oct 21, 1995  Date:06/01/2021  Time:1:37 PM   Provider Performing:Mathew Cooke  Mathew Cooke    Procedure: Intubation (31500)  Indication(s) Respiratory Failure  Consent Unable to obtain consent due to emergent nature of procedure.   Anesthesia Versed   Time Out Verified patient identification, verified procedure, site/side was marked, verified correct patient position, special equipment/implants available, medications/allergies/relevant history reviewed, required imaging and test results available.   Sterile Technique Usual hand hygeine, masks, and gloves were used   Procedure Description Patient positioned in bed supine.  Sedation given as noted above.  Patient was intubated with endotracheal tube using  4 .  View was Grade 2 only posterior commissure .  Number of attempts was  2 .  Colorimetric CO2 detector was consistent with tracheal placement.   Complications/Tolerance Pt continues to have low spo2 Chest X-ray is ordered to verify placement.   EBL Minimal   Specimen(s) None

## 2021-06-02 NOTE — Significant Event (Addendum)
°  Interdisciplinary Goals of Care Family Meeting   Date carried out:: 2021-06-01  Location of the meeting: Unit  Member's involved: Nurse Practitioner, Family Member or next of kin, and Palliative care team member, Mathew Ferns, NP  Durable Power of Attorney or acting medical decision maker: Mother Mathew Cooke and father Mathew Cooke by telephone (lives in [place])  Discussion: We discussed goals of care for Mathew Cooke .  Spoke with mother and updated her on recent events and despite all aggressive interventions he continues to decline, unable to ventilate or oxygenate him.  We unfortunately have nothing further to offer.  His blood pressure is now dropping.  Then was present for telephone conversation with his father, Mathew Cooke with Mathew Ferns, NP with palliative care.  Father verbalized understanding of gravity of situation, but needed to speak to Mathew Cooke on the phone prior to making any decisions.  Mother stepped out and called [place] privately.  Mathew Cooke returned and confirmed that we should not perform CPR as this would be futile and parent's wish to transition to comfort care.  Ongoing support offered.  Father offered virtual visit but declined as he did not want to see him like this.   We have stopped his nimbex gtt.  One TOF returns, we will proceed with transition to full comfort care, however patient pass on his own prior to full transition of care.  Orders changed to reflect discussion.  Appreciate assistance and great care by Palliative Care team.   Code status: Full DNR  Disposition: In-patient comfort care  Time spent for the meeting: [place], ACNP [facility] 06/01/21, 2:34 PM  See Amion for pager If no response to pager, please call PCCM consult pager After 7:00 pm call Elink

## 2021-06-02 NOTE — Progress Notes (Signed)
Epoprostenol stopped at this time per verbal order from B. Simpson NP CCM.

## 2021-06-02 DEATH — deceased

## 2021-06-08 LAB — FUNGUS CULTURE WITH STAIN

## 2021-06-08 LAB — FUNGAL ORGANISM REFLEX

## 2021-06-08 LAB — FUNGUS CULTURE RESULT

## 2021-06-25 LAB — ACID FAST CULTURE WITH REFLEXED SENSITIVITIES (MYCOBACTERIA): Acid Fast Culture: NEGATIVE

## 2022-01-25 IMAGING — DX DG CHEST 1V PORT
1 series · 2 of 2 positions shown · non-contrast
Comparison: 05/08/2021

CLINICAL DATA: Respiratory failure.  Status post intubation.

EXAM:
PORTABLE CHEST 1 VIEW

[Series 1: chest ap · 0.14mm/px · 2 of 2 slices shown]
[im 1/2]
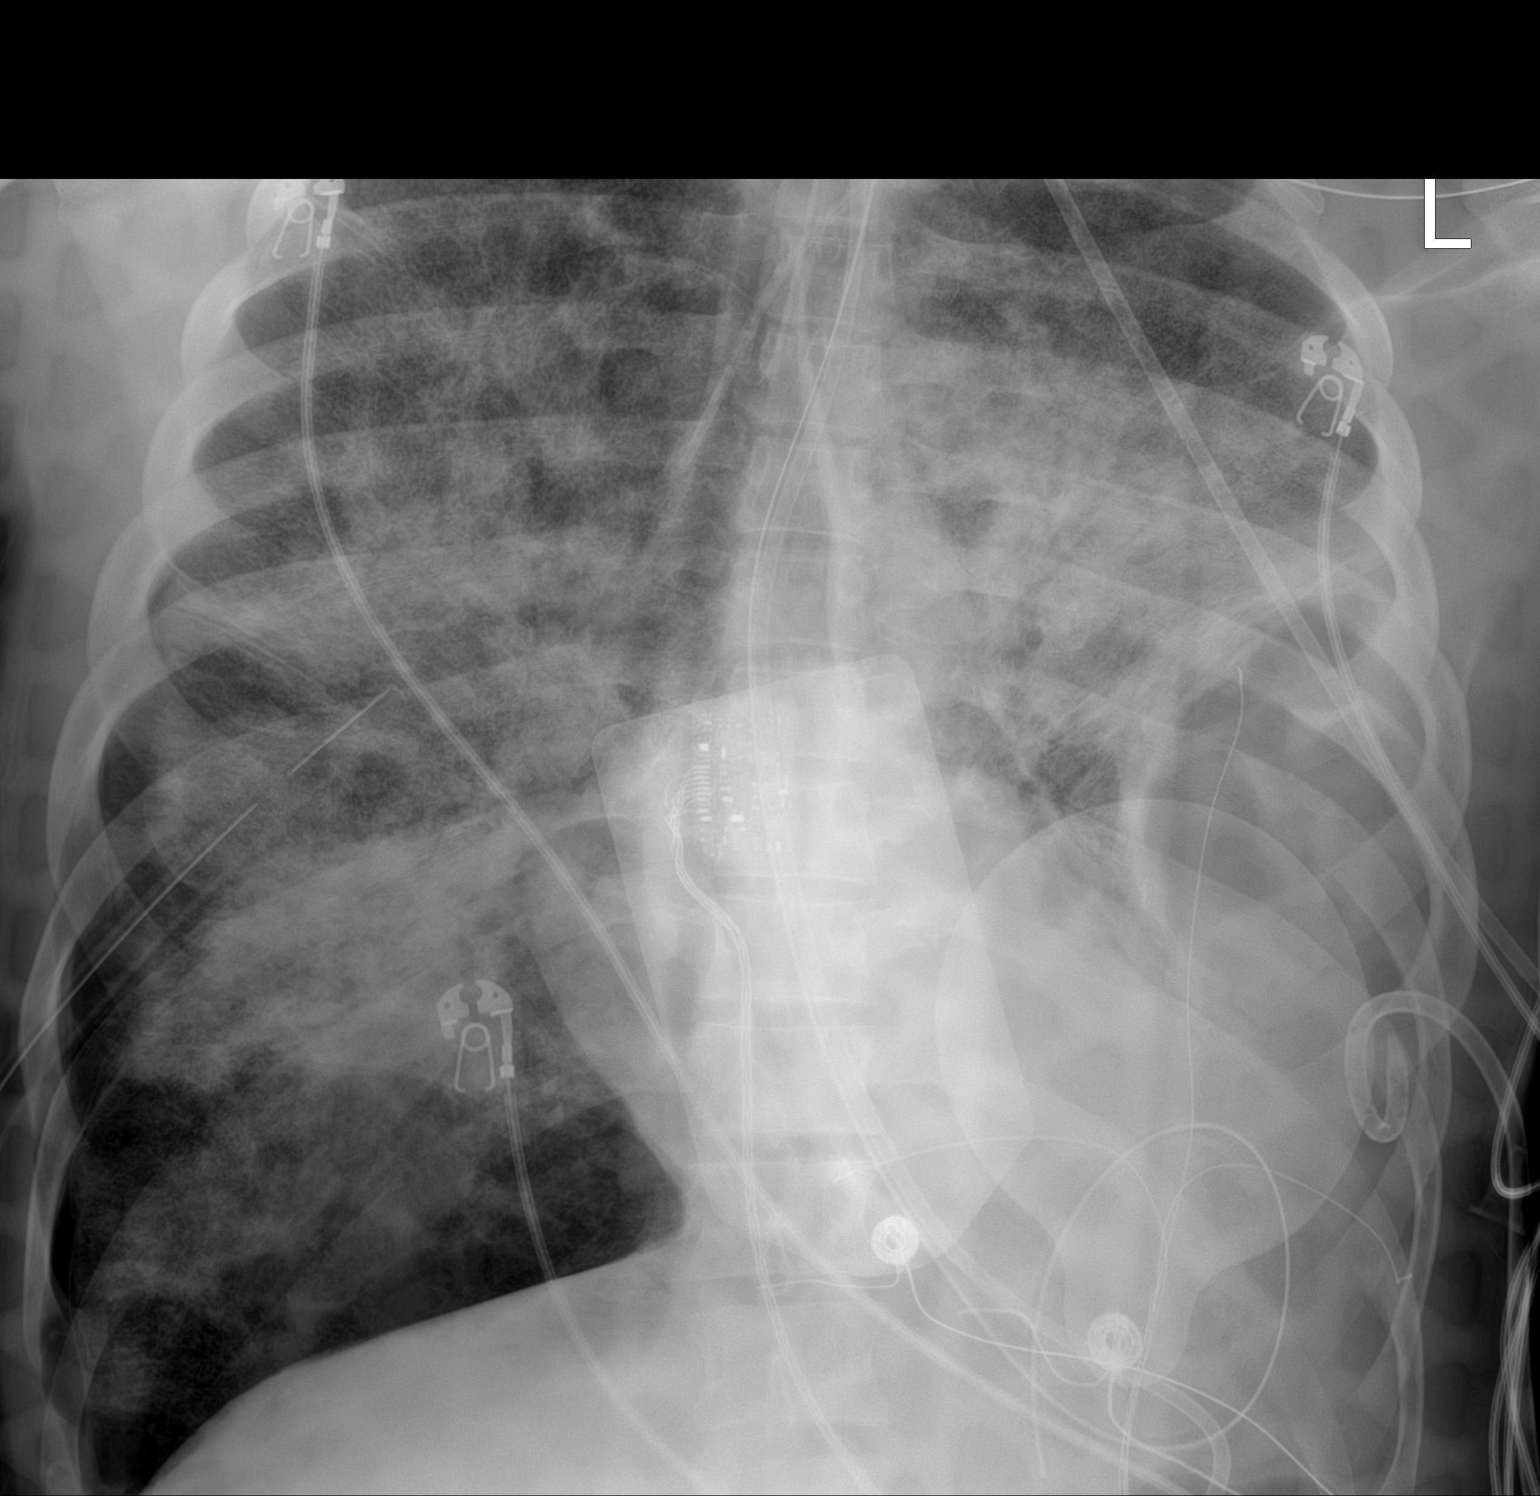
[im 2/2]
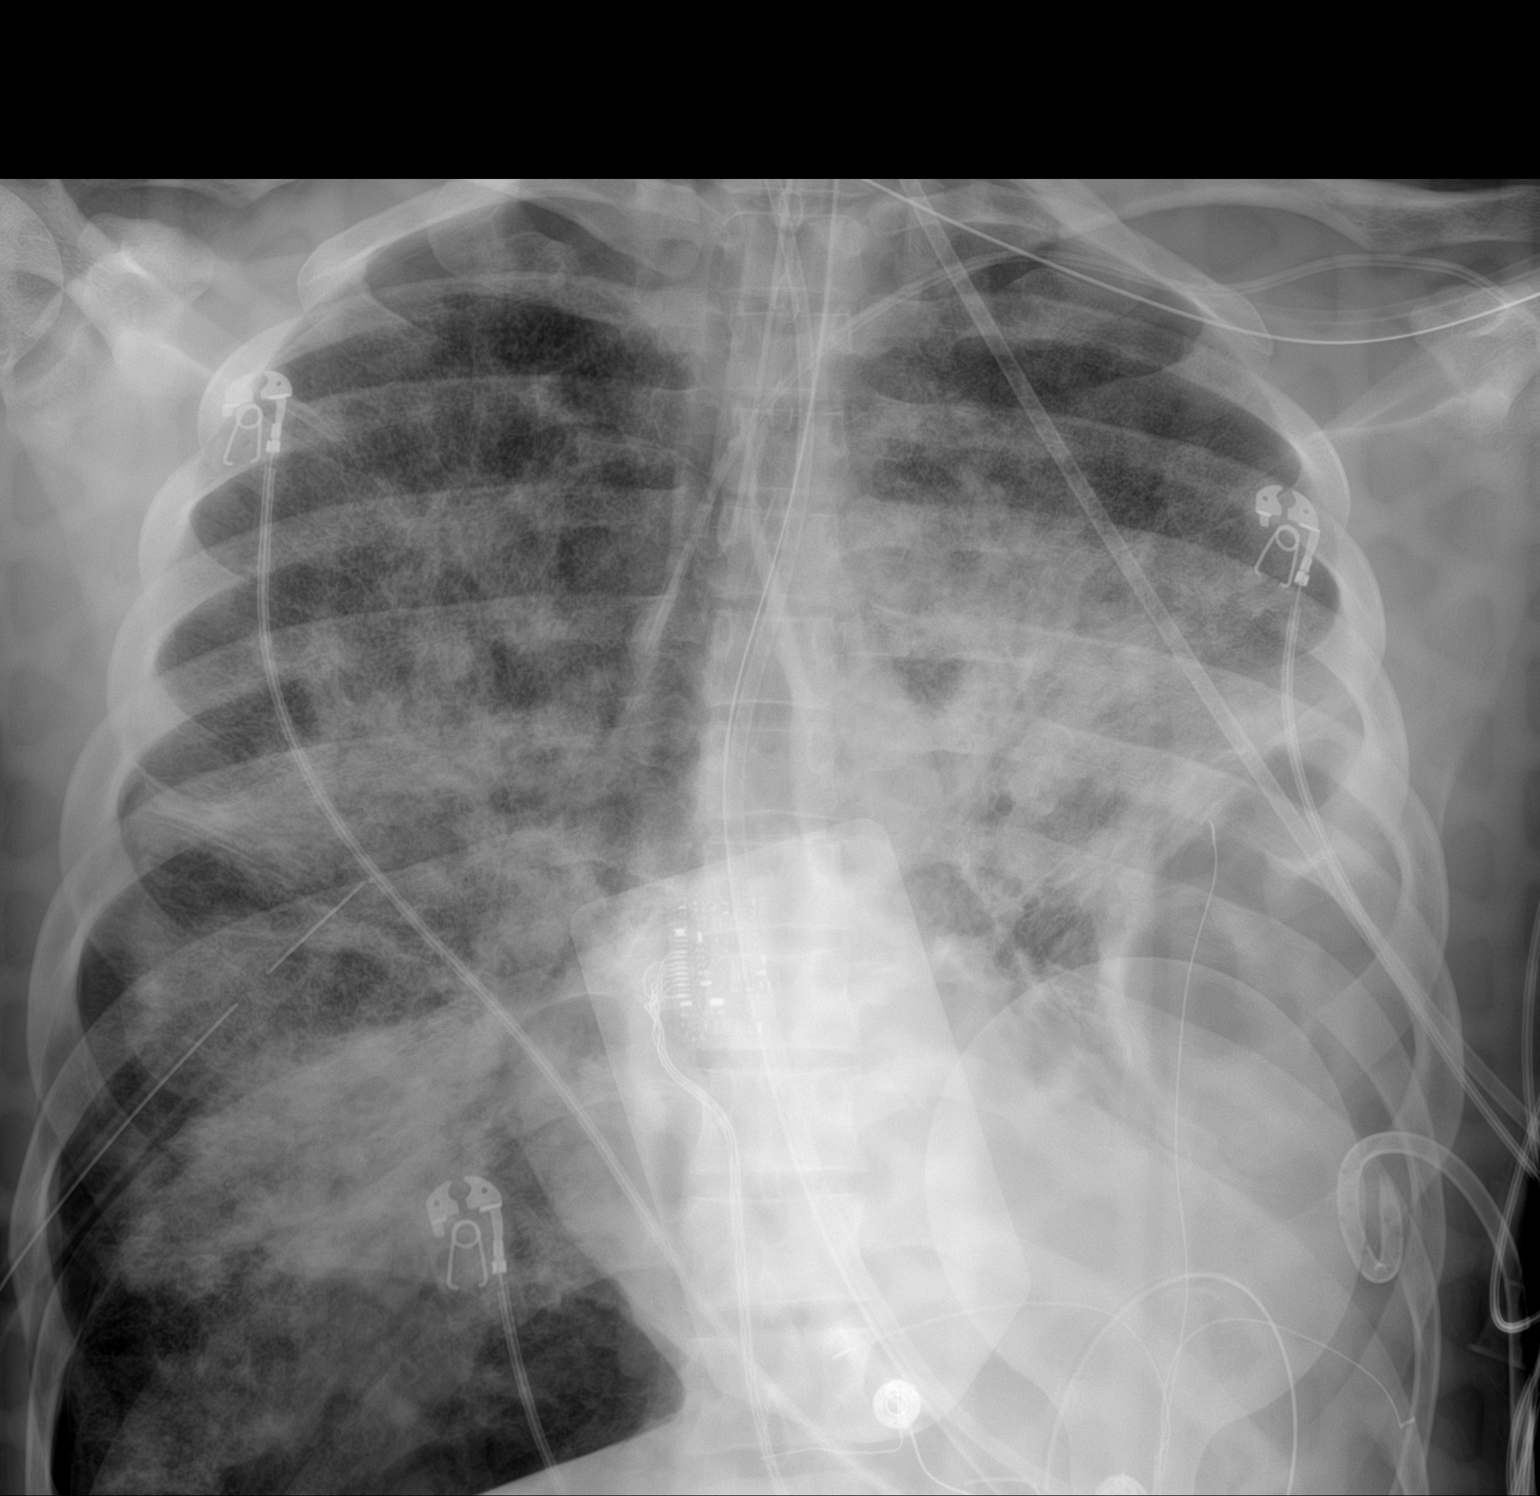

[2 of 2 positions shown; findings below may reference images not displayed]

FINDINGS: The ETT tip is stable above the carina. There is a feeding tube and
nasogastric tube with tip below the GE junction. Left chest tube is
in place with pigtail along the inferolateral margin of the left
lower chest. Left lower loculated pneumothorax is unchanged in
volume from previous exam. Stable appearance of the right chest
tube. Small right sided pneumothorax is identified. The apical
component measures 6 mm. Along the lateral left lung base this
measures approximately 8 mm. Bilateral interstitial and airspace
opacities are again noted. Compared with the previous exam the
overall aeration to lungs is not significantly changed.
IMPRESSION: 1. Stable support apparatus.
2. Stable loculated left pneumothorax.
3. Small right pneumothorax is more conspicuous compared with
previous exam.
4. No change in bilateral interstitial and airspace opacities.

## 2022-03-01 LAB — HISTOPLASMA ANTIGEN, URINE: Histoplasma Antigen, urine: <0.5 (ref <0.5)
